# Patient Record
Sex: Female | Born: 1965 | Race: White | Hispanic: No | Smoking: Current every day smoker
Health system: Southern US, Community
[De-identification: ages and names within clinical notes are randomized; demographics above are authoritative.]

## PROBLEM LIST (undated history)

## (undated) DIAGNOSIS — E785 Hyperlipidemia, unspecified: Secondary | ICD-10-CM

## (undated) DIAGNOSIS — K219 Gastro-esophageal reflux disease without esophagitis: Secondary | ICD-10-CM

## (undated) DIAGNOSIS — I10 Essential (primary) hypertension: Secondary | ICD-10-CM

## (undated) DIAGNOSIS — E079 Disorder of thyroid, unspecified: Secondary | ICD-10-CM

---

## 2004-03-30 ENCOUNTER — Ambulatory Visit: Payer: Self-pay | Admitting: Internal Medicine

## 2004-12-19 ENCOUNTER — Emergency Department: Payer: Self-pay | Admitting: Emergency Medicine

## 2007-10-28 ENCOUNTER — Ambulatory Visit: Payer: Self-pay

## 2008-01-22 ENCOUNTER — Emergency Department: Payer: Self-pay | Admitting: Emergency Medicine

## 2008-07-17 ENCOUNTER — Emergency Department: Payer: Self-pay | Admitting: Emergency Medicine

## 2009-08-28 ENCOUNTER — Emergency Department: Payer: Self-pay | Admitting: Unknown Physician Specialty

## 2009-10-08 ENCOUNTER — Emergency Department: Payer: Self-pay | Admitting: Emergency Medicine

## 2009-10-25 ENCOUNTER — Ambulatory Visit: Payer: Self-pay | Admitting: Obstetrics & Gynecology

## 2009-10-28 ENCOUNTER — Ambulatory Visit: Payer: Self-pay | Admitting: Obstetrics & Gynecology

## 2011-05-14 ENCOUNTER — Emergency Department: Payer: Self-pay | Admitting: *Deleted

## 2013-12-26 ENCOUNTER — Emergency Department: Payer: Self-pay | Admitting: Internal Medicine

## 2014-08-14 ENCOUNTER — Ambulatory Visit
Admit: 2014-08-14 | Disposition: A | Discharge status: Home / Self Care | Payer: Self-pay | Attending: Internal Medicine | Admitting: Internal Medicine

## 2014-09-29 ENCOUNTER — Encounter: Payer: Self-pay | Admitting: Emergency Medicine

## 2014-09-29 DIAGNOSIS — I10 Essential (primary) hypertension: Secondary | ICD-10-CM | POA: Diagnosis not present

## 2014-09-29 DIAGNOSIS — Y998 Other external cause status: Secondary | ICD-10-CM | POA: Diagnosis not present

## 2014-09-29 DIAGNOSIS — R51 Headache: Secondary | ICD-10-CM | POA: Diagnosis present

## 2014-09-29 DIAGNOSIS — M545 Low back pain: Secondary | ICD-10-CM | POA: Diagnosis not present

## 2014-09-29 DIAGNOSIS — Y9389 Activity, other specified: Secondary | ICD-10-CM | POA: Insufficient documentation

## 2014-09-29 DIAGNOSIS — S1086XA Insect bite of other specified part of neck, initial encounter: Secondary | ICD-10-CM | POA: Diagnosis not present

## 2014-09-29 DIAGNOSIS — R42 Dizziness and giddiness: Secondary | ICD-10-CM | POA: Diagnosis not present

## 2014-09-29 DIAGNOSIS — W57XXXA Bitten or stung by nonvenomous insect and other nonvenomous arthropods, initial encounter: Secondary | ICD-10-CM | POA: Diagnosis not present

## 2014-09-29 DIAGNOSIS — J209 Acute bronchitis, unspecified: Secondary | ICD-10-CM | POA: Diagnosis not present

## 2014-09-29 DIAGNOSIS — Y9289 Other specified places as the place of occurrence of the external cause: Secondary | ICD-10-CM | POA: Diagnosis not present

## 2014-09-29 DIAGNOSIS — Z720 Tobacco use: Secondary | ICD-10-CM | POA: Diagnosis not present

## 2014-09-29 LAB — POCT RAPID STREP A: STREPTOCOCCUS, GROUP A SCREEN (DIRECT): NEGATIVE

## 2014-09-29 NOTE — ED Notes (Signed)
Pt presents to the ER from home with complaints of sinus congestion, pt reports she got a spider bite last Friday and ever since she has been experiencing headaches, pt reports she woke up this morning with redness to right eye. Pt reports she went to her PCP and was treated GERD. Pt talks in complete sentences no respiratory distress noted.

## 2014-09-30 ENCOUNTER — Emergency Department
Admission: EM | Admit: 2014-09-30 | Discharge: 2014-09-30 | Disposition: A | Discharge status: Home / Self Care | Payer: Medicaid Other | Attending: Emergency Medicine | Admitting: Emergency Medicine

## 2014-09-30 ENCOUNTER — Emergency Department: Payer: Medicaid Other

## 2014-09-30 DIAGNOSIS — J40 Bronchitis, not specified as acute or chronic: Secondary | ICD-10-CM

## 2014-09-30 DIAGNOSIS — J029 Acute pharyngitis, unspecified: Secondary | ICD-10-CM

## 2014-09-30 HISTORY — DX: Essential (primary) hypertension: I10

## 2014-09-30 HISTORY — DX: Hyperlipidemia, unspecified: E78.5

## 2014-09-30 HISTORY — DX: Disorder of thyroid, unspecified: E07.9

## 2014-09-30 LAB — CBC
HCT: 43.2 % (ref 35.0–47.0)
Hemoglobin: 14.3 g/dL (ref 12.0–16.0)
MCH: 31.1 pg (ref 26.0–34.0)
MCHC: 33.2 g/dL (ref 32.0–36.0)
MCV: 93.5 fL (ref 80.0–100.0)
Platelets: 185 10*3/uL (ref 150–440)
RBC: 4.61 MIL/uL (ref 3.80–5.20)
RDW: 12.3 % (ref 11.5–14.5)
WBC: 8.3 10*3/uL (ref 3.6–11.0)

## 2014-09-30 LAB — BASIC METABOLIC PANEL
Anion gap: 6 (ref 5–15)
BUN: 10 mg/dL (ref 6–20)
CO2: 26 mmol/L (ref 22–32)
CREATININE: 0.75 mg/dL (ref 0.44–1.00)
Calcium: 9.2 mg/dL (ref 8.9–10.3)
Chloride: 108 mmol/L (ref 101–111)
GFR calc Af Amer: >60 mL/min (ref >60)
GFR calc non Af Amer: >60 mL/min (ref >60)
Glucose, Bld: 93 mg/dL (ref 65–99)
Potassium: 3.5 mmol/L (ref 3.5–5.1)
SODIUM: 140 mmol/L (ref 135–145)

## 2014-09-30 LAB — TROPONIN I

## 2014-09-30 MED ORDER — METOCLOPRAMIDE HCL 5 MG/ML IJ SOLN
INTRAMUSCULAR | Status: AC
  Administered 2014-09-30: 10 mg via INTRAVENOUS
  Filled 2014-09-30: qty 2

## 2014-09-30 MED ORDER — SODIUM CHLORIDE 0.9 % IV BOLUS (SEPSIS)
1000.0000 mL | Freq: Once | INTRAVENOUS | Status: AC
  Administered 2014-09-30: 1000 mL via INTRAVENOUS

## 2014-09-30 MED ORDER — LIDOCAINE VISCOUS 2 % MT SOLN
OROMUCOSAL | Status: AC
  Administered 2014-09-30: 15 mL via OROMUCOSAL
  Filled 2014-09-30: qty 15

## 2014-09-30 MED ORDER — LIDOCAINE VISCOUS 2 % MT SOLN
15.0000 mL | Freq: Once | OROMUCOSAL | Status: AC
  Administered 2014-09-30: 15 mL via OROMUCOSAL

## 2014-09-30 MED ORDER — METOCLOPRAMIDE HCL 5 MG/ML IJ SOLN
10.0000 mg | Freq: Once | INTRAMUSCULAR | Status: AC
  Administered 2014-09-30: 10 mg via INTRAVENOUS

## 2014-09-30 MED ORDER — KETOROLAC TROMETHAMINE 30 MG/ML IJ SOLN
30.0000 mg | Freq: Once | INTRAMUSCULAR | Status: AC
  Administered 2014-09-30: 30 mg via INTRAVENOUS

## 2014-09-30 MED ORDER — BENZONATATE 100 MG PO CAPS: 100.0000 mg | ORAL_CAPSULE | Freq: Three times a day (TID) | ORAL | Status: AC | PRN

## 2014-09-30 MED ORDER — KETOROLAC TROMETHAMINE 30 MG/ML IJ SOLN
INTRAMUSCULAR | Status: AC
  Administered 2014-09-30: 30 mg via INTRAVENOUS
  Filled 2014-09-30: qty 1

## 2014-09-30 MED ORDER — AZITHROMYCIN 250 MG PO TABS: ORAL_TABLET | ORAL | Status: AC

## 2014-09-30 NOTE — ED Provider Notes (Signed)
[facility] Emergency Department Provider Note  ____________________________________________  Time seen: Approximately 3:15 AM  I have reviewed the triage vital signs and the nursing notes.   HISTORY  Chief Complaint Headache; Insect Bite; and Sore Throat    HPI Ronnette JuniperCarolyn Retana is a 49 y.o. female who comes in today reporting that she does not feel well. She reports that her throat is raw and burning down to her chest. She reports that she has pain in her temples and the back of her head. She reports that she went to the doctor today and when she came home fell asleep. She reports that when she woke up she felt dizzy. She reports that she has some blood this come to the surface of her right eye and she became concerned. She also reports that her gums are swollen inside her mouth. She reports the doctor told her that she may have acid reflux but did not do anything else for it. She reports that the symptoms has been going on for 3-4 days. He reports that she was also bitten by a spider in her pubic hairs. She reports that it initially itched and then she thought she saw pus coming out of the area of the bite mark. She reports that her neck feels swollen and puffy. She came in because she did not feel well.He reports that her pain is a 6 out of 10 in intensity.   Past Medical History  Diagnosis Date  . Hypertension   . Thyroid disease   . Hyperlipemia     There are no active problems to display for this patient.   History reviewed. No pertinent past surgical history.  Current Outpatient Rx  Name  Route  Sig  Dispense  Refill  . azithromycin (ZITHROMAX Z-PAK) 250 MG tablet      Take 2 tablets (500 mg) on  Day 1,  followed by 1 tablet (250 mg) once daily on Days 2 through 5.   6 each   0   . benzonatate (TESSALON PERLES) 100 MG capsule   Oral   Take 1 capsule (100 mg total) by mouth 3 (three) times daily as needed for cough.   15 capsule   0      Allergies Review of patient's allergies indicates no known allergies.  No family history on file.  Social History History  Substance Use Topics  . Smoking status: Current Every Day Smoker -- 0.50 packs/day    Types: Cigarettes  . Smokeless tobacco: Not on file  . Alcohol Use: No    Review of Systems Constitutional: No fever/chills Eyes: No visual changes. ENT:  sore throat. Cardiovascular:  chest pain. Respiratory: shortness of breath. Gastrointestinal: Loose stools Genitourinary: Negative for dysuria. Musculoskeletal:  back pain. Skin: Negative for rash. Neurological: Headache.  10-point ROS otherwise negative.  ____________________________________________   PHYSICAL EXAM:  VITAL SIGNS: ED Triage Vitals  Enc Vitals Group     BP 09/29/14 2226 169/66 mmHg     Pulse Rate 09/29/14 2226 71     Resp 09/29/14 2226 20     Temp 09/29/14 2226 98.5 F (36.9 C)     Temp Source 09/29/14 2226 Oral     SpO2 09/29/14 2226 99 %     Weight 09/29/14 2226 108 lb (48.988 kg)     Height 09/29/14 2226 5\' 2"  (1.575 m)     Head Cir --      Peak Flow --      Pain Score  09/29/14 2228 8     Pain Loc --      Pain Edu? --      Excl. in GC? --     Constitutional: Alert and oriented. Well appearing and in mild distress. Eyes: Conjunctivae are normal. PERRL. EOMI. Head: Atraumatic. Nose: No congestion/rhinnorhea. Mouth/Throat: Mucous membranes are moist.  Oropharynx non-erythematous. Cardiovascular: Normal rate, regular rhythm. Grossly normal heart sounds.  Good peripheral circulation. Respiratory: Normal respiratory effort.  No retractions. Lungs CTAB. Gastrointestinal: Soft and nontender. No distention. Positive bowel sounds Genitourinary: Deferred Musculoskeletal: No lower extremity tenderness nor edema.  No joint effusions. Neurologic:  Normal speech and language. No gross focal neurologic deficits are appreciated.   Skin:  Skin is warm, dry and intact. No rash  noted. Psychiatric: Mood and affect are normal.   ____________________________________________   LABS (all labs ordered are listed, but only abnormal results are displayed)  Labs Reviewed  CULTURE, GROUP A STREP ([facility] ONLY)  CBC  BASIC METABOLIC PANEL  TROPONIN I  POCT RAPID STREP A   ____________________________________________  EKG  ED ECG REPORT I, Rebecka Apley, the attending physician, personally viewed and interpreted this ECG.   Date: 09/30/2014  EKG Time: 429  Rate: 61  Rhythm: normal sinus rhythm  Axis: Normal  Intervals:none  ST&T Change: None  ____________________________________________  RADIOLOGY  Chest x-ray: Hyperinflation without localizing pulmonary process. This may be related to bronchitis asthma or smoking related lung disease ____________________________________________   PROCEDURES  Procedure(s) performed: None  Critical Care performed: No  ____________________________________________   INITIAL IMPRESSION / ASSESSMENT AND PLAN / ED COURSE  Pertinent labs & imaging results that were available during my care of the patient were reviewed by me and considered in my medical decision making (see chart for details).  This is a 49 year old female who comes in with multiple complaints including sore throat and shortness of breath chest pain and not feeling well. I will check some blood work. I will do a chest x-ray and give the patient some medicine for her headache. I will give the patient some fluid as well and reassessed when she's receive these interventions.  The patient does feel improved after the fluids and the medication. I will discharge the patient home with some medicine for cough as well as for bronchitis. The patient will follow back up with her primary care physician. ____________________________________________   FINAL CLINICAL IMPRESSION(S) / ED DIAGNOSES  Final diagnoses:  Bronchitis  Pharyngitis      Rebecka Apley, MD 09/30/14 [phone]

## 2014-09-30 NOTE — Discharge Instructions (Signed)
Sore Throat A sore throat is a painful, burning, sore, or scratchy feeling of the throat. There may be pain or tenderness when swallowing or talking. You may have other symptoms with a sore throat. These include coughing, sneezing, fever, or a swollen neck. A sore throat is often the first sign of another sickness. These sicknesses may include a cold, flu, strep throat, or an infection called mono. Most sore throats go away without medical treatment.  HOME CARE   Only take medicine as told by your doctor.  Drink enough fluids to keep your pee (urine) clear or pale yellow.  Rest as needed.  Try using throat sprays, lozenges, or suck on hard candy (if older than 4 years or as told).  Sip warm liquids, such as broth, herbal tea, or warm water with honey. Try sucking on frozen ice pops or drinking cold liquids.  Rinse the mouth (gargle) with salt water. Mix 1 teaspoon salt with 8 ounces of water.  Do not smoke. Avoid being around others when they are smoking.  Put a humidifier in your bedroom at night to moisten the air. You can also turn on a hot shower and sit in the bathroom for 5-10 minutes. Be sure the bathroom door is closed. GET HELP RIGHT AWAY IF:   You have trouble breathing.  You cannot swallow fluids, soft foods, or your spit (saliva).  You have more puffiness (swelling) in the throat.  Your sore throat does not get better in 7 days.  You feel sick to your stomach (nauseous) and throw up (vomit).  You have a fever or lasting symptoms for more than 2-3 days.  You have a fever and your symptoms suddenly get worse. MAKE SURE YOU:   Understand these instructions.  Will watch your condition.  Will get help right away if you are not doing well or get worse. Document Released: 01/25/2008 Document Revised: 01/10/2012 Document Reviewed: 12/24/2011 [facility] Patient Information 2015 [employer] [place]. This information is not intended to replace advice given to you by your health  care provider. Make sure you discuss any questions you have with your health care provider.  Upper Respiratory Infection, Adult An upper respiratory infection (URI) is also sometimes known as the common cold. The upper respiratory tract includes the nose, sinuses, throat, trachea, and bronchi. Bronchi are the airways leading to the lungs. Most people improve within 1 week, but symptoms can last up to 2 weeks. A residual cough may last even longer.  CAUSES Many different viruses can infect the tissues lining the upper respiratory tract. The tissues become irritated and inflamed and often become very moist. Mucus production is also common. A cold is contagious. You can easily spread the virus to others by oral contact. This includes kissing, sharing a glass, coughing, or sneezing. Touching your mouth or nose and then touching a surface, which is then touched by another person, can also spread the virus. SYMPTOMS  Symptoms typically develop 1 to 3 days after you come in contact with a cold virus. Symptoms vary from person to person. They may include:  Runny nose.  Sneezing.  Nasal congestion.  Sinus irritation.  Sore throat.  Loss of voice (laryngitis).  Cough.  Fatigue.  Muscle aches.  Loss of appetite.  Headache.  Low-grade fever. DIAGNOSIS  You might diagnose your own cold based on familiar symptoms, since most people get a cold 2 to 3 times a year. Your caregiver can confirm this based on your exam. Most importantly, your caregiver  can check that your symptoms are not due to another disease such as strep throat, sinusitis, pneumonia, asthma, or epiglottitis. Blood tests, throat tests, and X-rays are not necessary to diagnose a common cold, but they may sometimes be helpful in excluding other more serious diseases. Your caregiver will decide if any further tests are required. RISKS AND COMPLICATIONS  You may be at risk for a more severe case of the common cold if you smoke  cigarettes, have chronic heart disease (such as heart failure) or lung disease (such as asthma), or if you have a weakened immune system. The very young and very old are also at risk for more serious infections. Bacterial sinusitis, middle ear infections, and bacterial pneumonia can complicate the common cold. The common cold can worsen asthma and chronic obstructive pulmonary disease (COPD). Sometimes, these complications can require emergency medical care and may be life-threatening. PREVENTION  The best way to protect against getting a cold is to practice good hygiene. Avoid oral or hand contact with people with cold symptoms. Wash your hands often if contact occurs. There is no clear evidence that vitamin C, vitamin E, echinacea, or exercise reduces the chance of developing a cold. However, it is always recommended to get plenty of rest and practice good nutrition. TREATMENT  Treatment is directed at relieving symptoms. There is no cure. Antibiotics are not effective, because the infection is caused by a virus, not by bacteria. Treatment may include:  Increased fluid intake. Sports drinks offer valuable electrolytes, sugars, and fluids.  Breathing heated mist or steam (vaporizer or shower).  Eating chicken soup or other clear broths, and maintaining good nutrition.  Getting plenty of rest.  Using gargles or lozenges for comfort.  Controlling fevers with ibuprofen or acetaminophen as directed by your caregiver.  Increasing usage of your inhaler if you have asthma. Zinc gel and zinc lozenges, taken in the first 24 hours of the common cold, can shorten the duration and lessen the severity of symptoms. Pain medicines may help with fever, muscle aches, and throat pain. A variety of non-prescription medicines are available to treat congestion and runny nose. Your caregiver can make recommendations and may suggest nasal or lung inhalers for other symptoms.  HOME CARE INSTRUCTIONS   Only take  over-the-counter or prescription medicines for pain, discomfort, or fever as directed by your caregiver.  Use a warm mist humidifier or inhale steam from a shower to increase air moisture. This may keep secretions moist and make it easier to breathe.  Drink enough water and fluids to keep your urine clear or pale yellow.  Rest as needed.  Return to work when your temperature has returned to normal or as your caregiver advises. You may need to stay home longer to avoid infecting others. You can also use a face mask and careful hand washing to prevent spread of the virus. SEEK MEDICAL CARE IF:   After the first few days, you feel you are getting worse rather than better.  You need your caregiver's advice about medicines to control symptoms.  You develop chills, worsening shortness of breath, or brown or red sputum. These may be signs of pneumonia.  You develop yellow or brown nasal discharge or pain in the face, especially when you bend forward. These may be signs of sinusitis.  You develop a fever, swollen neck glands, pain with swallowing, or white areas in the back of your throat. These may be signs of strep throat. SEEK IMMEDIATE MEDICAL CARE IF:  You have a fever.  You develop severe or persistent headache, ear pain, sinus pain, or chest pain.  You develop wheezing, a prolonged cough, cough up blood, or have a change in your usual mucus (if you have chronic lung disease).  You develop sore muscles or a stiff neck. Document Released: 10/11/2000 Document Revised: 07/10/2011 Document Reviewed: 07/23/2013 [facility] Patient Information 2015 [place], [place]. This information is not intended to replace advice given to you by your health care provider. Make sure you discuss any questions you have with your health care provider.

## 2014-10-02 LAB — CULTURE, GROUP A STREP (THRC)

## 2014-11-10 ENCOUNTER — Emergency Department
Admission: EM | Admit: 2014-11-10 | Discharge: 2014-11-10 | Disposition: A | Discharge status: Home / Self Care | Payer: Medicaid Other | Attending: Emergency Medicine | Admitting: Emergency Medicine

## 2014-11-10 ENCOUNTER — Emergency Department: Payer: Medicaid Other

## 2014-11-10 DIAGNOSIS — Y998 Other external cause status: Secondary | ICD-10-CM | POA: Diagnosis not present

## 2014-11-10 DIAGNOSIS — X58XXXA Exposure to other specified factors, initial encounter: Secondary | ICD-10-CM | POA: Diagnosis not present

## 2014-11-10 DIAGNOSIS — S299XXA Unspecified injury of thorax, initial encounter: Secondary | ICD-10-CM | POA: Insufficient documentation

## 2014-11-10 DIAGNOSIS — Y9389 Activity, other specified: Secondary | ICD-10-CM | POA: Diagnosis not present

## 2014-11-10 DIAGNOSIS — Z720 Tobacco use: Secondary | ICD-10-CM | POA: Diagnosis not present

## 2014-11-10 DIAGNOSIS — I10 Essential (primary) hypertension: Secondary | ICD-10-CM | POA: Diagnosis not present

## 2014-11-10 DIAGNOSIS — T148XXA Other injury of unspecified body region, initial encounter: Secondary | ICD-10-CM

## 2014-11-10 DIAGNOSIS — Y9289 Other specified places as the place of occurrence of the external cause: Secondary | ICD-10-CM | POA: Insufficient documentation

## 2014-11-10 LAB — POCT RAPID STREP A: STREPTOCOCCUS, GROUP A SCREEN (DIRECT): NEGATIVE

## 2014-11-10 MED ORDER — IBUPROFEN 800 MG PO TABS: 800.0000 mg | ORAL_TABLET | Freq: Three times a day (TID) | ORAL | Status: DC | PRN

## 2014-11-10 NOTE — Discharge Instructions (Signed)
Please seek medical attention for any high fevers, chest pain, shortness of breath, change in behavior, persistent vomiting, bloody stool or any other new or concerning symptoms.  Muscle Strain A muscle strain is an injury that occurs when a muscle is stretched beyond its normal length. Usually a small number of muscle fibers are torn when this happens. Muscle strain is rated in degrees. First-degree strains have the least amount of muscle fiber tearing and pain. Second-degree and third-degree strains have increasingly more tearing and pain.  Usually, recovery from muscle strain takes 1-2 weeks. Complete healing takes 5-6 weeks.  CAUSES  Muscle strain happens when a sudden, violent force placed on a muscle stretches it too far. This may occur with lifting, sports, or a fall.  RISK FACTORS Muscle strain is especially common in athletes.  SIGNS AND SYMPTOMS At the site of the muscle strain, there may be:  Pain.  Bruising.  Swelling.  Difficulty using the muscle due to pain or lack of normal function. DIAGNOSIS  Your health care provider will perform a physical exam and ask about your medical history. TREATMENT  Often, the best treatment for a muscle strain is resting, icing, and applying cold compresses to the injured area.  HOME CARE INSTRUCTIONS   Use the PRICE method of treatment to promote muscle healing during the first 2-3 days after your injury. The PRICE method involves:  Protecting the muscle from being injured again.  Restricting your activity and resting the injured body part.  Icing your injury. To do this, put ice in a plastic bag. Place a towel between your skin and the bag. Then, apply the ice and leave it on from 15-20 minutes each hour. After the third day, switch to moist heat packs.  Apply compression to the injured area with a splint or elastic bandage. Be careful not to wrap it too tightly. This may interfere with blood circulation or increase swelling.  Elevate  the injured body part above the level of your heart as often as you can.  Only take over-the-counter or prescription medicines for pain, discomfort, or fever as directed by your health care provider.  Warming up prior to exercise helps to prevent future muscle strains. SEEK MEDICAL CARE IF:   You have increasing pain or swelling in the injured area.  You have numbness, tingling, or a significant loss of strength in the injured area. MAKE SURE YOU:   Understand these instructions.  Will watch your condition.  Will get help right away if you are not doing well or get worse. Document Released: 04/17/2005 Document Revised: 02/05/2013 Document Reviewed: 11/14/2012 [facility] Patient Information 2015 [place], [place]. This information is not intended to replace advice given to you by your health care provider. Make sure you discuss any questions you have with your health care provider.
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
## 2014-11-12 LAB — CULTURE, GROUP A STREP (THRC)

## 2014-11-29 ENCOUNTER — Emergency Department: Payer: Medicaid Other

## 2014-11-29 DIAGNOSIS — Z79899 Other long term (current) drug therapy: Secondary | ICD-10-CM | POA: Diagnosis not present

## 2014-11-29 DIAGNOSIS — S99922A Unspecified injury of left foot, initial encounter: Secondary | ICD-10-CM | POA: Insufficient documentation

## 2014-11-29 DIAGNOSIS — I10 Essential (primary) hypertension: Secondary | ICD-10-CM | POA: Insufficient documentation

## 2014-11-29 DIAGNOSIS — Y9301 Activity, walking, marching and hiking: Secondary | ICD-10-CM | POA: Diagnosis not present

## 2014-11-29 DIAGNOSIS — W228XXA Striking against or struck by other objects, initial encounter: Secondary | ICD-10-CM | POA: Diagnosis not present

## 2014-11-29 DIAGNOSIS — Y998 Other external cause status: Secondary | ICD-10-CM | POA: Diagnosis not present

## 2014-11-29 DIAGNOSIS — Y9289 Other specified places as the place of occurrence of the external cause: Secondary | ICD-10-CM | POA: Diagnosis not present

## 2014-11-29 DIAGNOSIS — Z720 Tobacco use: Secondary | ICD-10-CM | POA: Diagnosis not present

## 2014-11-29 NOTE — ED Notes (Signed)
Mom says on Friday night she accidentally kicked a stool in her garden; swelling to the top of her left foot; ambulatory with no difficulty

## 2014-11-30 ENCOUNTER — Emergency Department
Admission: EM | Admit: 2014-11-30 | Discharge: 2014-11-30 | Disposition: A | Discharge status: Home / Self Care | Payer: Medicaid Other | Attending: Emergency Medicine | Admitting: Emergency Medicine

## 2014-11-30 ENCOUNTER — Encounter: Payer: Self-pay | Admitting: Emergency Medicine

## 2014-11-30 DIAGNOSIS — M79672 Pain in left foot: Secondary | ICD-10-CM

## 2014-11-30 HISTORY — DX: Gastro-esophageal reflux disease without esophagitis: K21.9

## 2014-11-30 NOTE — Discharge Instructions (Signed)
Please seek medical attention for any high fevers, chest pain, shortness of breath, change in behavior, persistent vomiting, bloody stool or any other new or concerning symptoms. ° °Musculoskeletal Pain °Musculoskeletal pain is muscle and boney aches and pains. These pains can occur in any part of the body. Your caregiver may treat you without knowing the cause of the pain. They may treat you if blood or urine tests, X-rays, and other tests were normal.  °CAUSES °There is often not a definite cause or reason for these pains. These pains may be caused by a type of germ (virus). The discomfort may also come from overuse. Overuse includes working out too hard when your body is not fit. Boney aches also come from weather changes. Bone is sensitive to atmospheric pressure changes. °HOME CARE INSTRUCTIONS  °· Ask when your test results will be ready. Make sure you get your test results. °· Only take over-the-counter or prescription medicines for pain, discomfort, or fever as directed by your caregiver. If you were given medications for your condition, do not drive, operate machinery or power tools, or sign legal documents for 24 hours. Do not drink alcohol. Do not take sleeping pills or other medications that may interfere with treatment. °· Continue all activities unless the activities cause more pain. When the pain lessens, slowly resume normal activities. Gradually increase the intensity and duration of the activities or exercise. °· During periods of severe pain, bed rest may be helpful. Lay or sit in any position that is comfortable. °· Putting ice on the injured area. °¨ Put ice in a bag. °¨ Place a towel between your skin and the bag. °¨ Leave the ice on for 15 to 20 minutes, 3 to 4 times a day. °· Follow up with your caregiver for continued problems and no reason can be found for the pain. If the pain becomes worse or does not go away, it may be necessary to repeat tests or do additional testing. Your caregiver  may need to look further for a possible cause. °SEEK IMMEDIATE MEDICAL CARE IF: °· You have pain that is getting worse and is not relieved by medications. °· You develop chest pain that is associated with shortness or breath, sweating, feeling sick to your stomach (nauseous), or throw up (vomit). °· Your pain becomes localized to the abdomen. °· You develop any new symptoms that seem different or that concern you. °MAKE SURE YOU:  °· Understand these instructions. °· Will watch your condition. °· Will get help right away if you are not doing well or get worse. °Document Released: 04/17/2005 Document Revised: 07/10/2011 Document Reviewed: 12/20/2012 °[employer]® Patient Information ©2015 [employer]. This information is not intended to replace advice given to you by your health care provider. Make sure you discuss any questions you have with your health care provider. ° °

## 2014-11-30 NOTE — ED Provider Notes (Signed)
[facility] Emergency Department Provider Note    ____________________________________________  Time seen: 0300  I have reviewed the triage vital signs and the nursing notes.   HISTORY  Chief Complaint Foot Pain   History limited by: Not Limited   HPI Beth Moore is a 49 y.o. female who presents to the emergency department with concerns for left foot pain. She states that she hit it against a stool whilst walking in the dark the night before last. Since then she has had pain. She feels like she cannot bend her toes quite this effectively. She denies any numbness. She denies any other injuries. Denies any fevers.   Past Medical History  Diagnosis Date  . Hypertension   . Thyroid disease   . Hyperlipemia   . GERD (gastroesophageal reflux disease)     There are no active problems to display for this patient.   History reviewed. No pertinent past surgical history.  Current Outpatient Rx  Name  Route  Sig  Dispense  Refill  . benzonatate (TESSALON PERLES) 100 MG capsule   Oral   Take 1 capsule (100 mg total) by mouth 3 (three) times daily as needed for cough.   15 capsule   0   . diltiazem (CARDIZEM CD) 120 MG 24 hr capsule   Oral   Take 1 capsule by mouth daily.      6   . ibuprofen (ADVIL,MOTRIN) 800 MG tablet   Oral   Take 1 tablet (800 mg total) by mouth every 8 (eight) hours as needed.   30 tablet   0   . lovastatin (MEVACOR) 10 MG tablet   Oral   Take 10 mg by mouth at bedtime.      6   . meloxicam (MOBIC) 15 MG tablet   Oral   Take 1 tablet by mouth as needed.      0   . montelukast (SINGULAIR) 10 MG tablet   Oral   Take 1 tablet by mouth at bedtime.      3   . omeprazole (PRILOSEC) 40 MG capsule   Oral   Take 40 mg by mouth daily.      5   . SYNTHROID 100 MCG tablet   Oral   Take 1 tablet by mouth every morning.      6     Dispense as written.   . traMADol (ULTRAM) 50 MG tablet   Oral   Take 50 mg by  mouth as needed.      1     Allergies Review of patient's allergies indicates no known allergies.  History reviewed. No pertinent family history.  Social History History  Substance Use Topics  . Smoking status: Current Every Day Smoker -- 0.50 packs/day    Types: Cigarettes  . Smokeless tobacco: Not on file  . Alcohol Use: No    Review of Systems  Constitutional: Negative for fever. Cardiovascular: Negative for chest pain. Respiratory: Negative for shortness of breath. Gastrointestinal: Negative for abdominal pain, vomiting and diarrhea. Genitourinary: Negative for dysuria. Musculoskeletal: Negative for back pain. Left foot pain Skin: Negative for rash. Neurological: Negative for headaches, focal weakness or numbness.   10-point ROS otherwise negative.  ____________________________________________   PHYSICAL EXAM:  VITAL SIGNS: ED Triage Vitals  Enc Vitals Group     BP 11/29/14 2229 149/91 mmHg     Pulse Rate 11/29/14 2229 74     Resp 11/29/14 2229 18     Temp 11/29/14  2229 98.1 F (36.7 C)     Temp Source 11/29/14 2229 Oral     SpO2 11/29/14 2229 99 %     Weight 11/29/14 2229 108 lb (48.988 kg)     Height 11/29/14 2229  (1.575 m)     Head Cir --      Peak Flow --      Pain Score 11/29/14 2230 3   Constitutional: Alert and oriented. Well appearing and in no distress. Eyes: Conjunctivae are normal. PERRL. Normal extraocular movements. ENT   Head: Normocephalic and atraumatic.   Nose: No congestion/rhinnorhea.   Mouth/Throat: Mucous membranes are moist.   Neck: No stridor. Hematological/Lymphatic/Immunilogical: No cervical lymphadenopathy. Cardiovascular: Normal rate, regular rhythm.  No murmurs, rubs, or gallops. Respiratory: Normal respiratory effort without tachypnea nor retractions. Breath sounds are clear and equal bilaterally. No wheezes/rales/rhonchi. Gastrointestinal: Soft and nontender. No distention.  Genitourinary:  Deferred Musculoskeletal: Normal range of motion in all extremities. No joint effusions.  No lower extremity tenderness nor edema. Neurologic:  Normal speech and language. No gross focal neurologic deficits are appreciated. Speech is normal.  Skin:  Skin is warm, dry and intact. No rash noted. Psychiatric: Mood and affect are normal. Speech and behavior are normal. Patient exhibits appropriate insight and judgment.  ____________________________________________    LABS (pertinent positives/negatives)  None  ____________________________________________   EKG  None  ____________________________________________    RADIOLOGY  Left Foot IMPRESSION: Negative.  ____________________________________________   PROCEDURES  Procedure(s) performed: None  Critical Care performed: No  ____________________________________________   INITIAL IMPRESSION / ASSESSMENT AND PLAN / ED COURSE  Pertinent labs & imaging results that were available during my care of the patient were reviewed by me and considered in my medical decision making (see chart for details).  Patient here with left foot pain after kicking a stool. X-rays negative. No concerning findings on physical exam.  ____________________________________________   FINAL CLINICAL IMPRESSION(S) / ED DIAGNOSES  Final diagnoses:  Left foot pain     Phineas Semen, MD 11/30/14 [phone]

## 2015-09-02 ENCOUNTER — Other Ambulatory Visit: Payer: Self-pay | Admitting: *Deleted

## 2016-07-27 ENCOUNTER — Encounter: Payer: Self-pay | Admitting: Emergency Medicine

## 2016-07-27 ENCOUNTER — Emergency Department
Admission: EM | Admit: 2016-07-27 | Discharge: 2016-07-27 | Disposition: A | Discharge status: Home / Self Care | Payer: Medicaid Other | Attending: Emergency Medicine | Admitting: Emergency Medicine

## 2016-07-27 DIAGNOSIS — Z79899 Other long term (current) drug therapy: Secondary | ICD-10-CM | POA: Diagnosis not present

## 2016-07-27 DIAGNOSIS — F17210 Nicotine dependence, cigarettes, uncomplicated: Secondary | ICD-10-CM | POA: Diagnosis not present

## 2016-07-27 DIAGNOSIS — I10 Essential (primary) hypertension: Secondary | ICD-10-CM | POA: Diagnosis not present

## 2016-07-27 DIAGNOSIS — L259 Unspecified contact dermatitis, unspecified cause: Secondary | ICD-10-CM | POA: Diagnosis not present

## 2016-07-27 DIAGNOSIS — L299 Pruritus, unspecified: Secondary | ICD-10-CM | POA: Diagnosis present

## 2016-07-27 MED ORDER — BACITRACIN ZINC 500 UNIT/GM EX OINT
TOPICAL_OINTMENT | Freq: Once | CUTANEOUS | Status: AC
  Administered 2016-07-27: 1 via TOPICAL
  Filled 2016-07-27: qty 0.9

## 2016-07-27 NOTE — ED Notes (Signed)
Pt discharged home after verbalizing understanding of discharge instructions; nad noted. 

## 2016-07-27 NOTE — ED Triage Notes (Signed)
Patient ambulatory to triage with steady gait, without difficulty or distress noted; pt reports "spider bite"; indicates small area to left upper arm of discolored skin; no redness or ulceration noted

## 2016-07-27 NOTE — Discharge Instructions (Signed)
You may apply calamine lotion or Benadryl cream as needed for relief of symptoms. Return to the ER for worsening symptoms, persistent vomiting, difficulty breathing, facial swelling or other concerns.

## 2016-07-27 NOTE — ED Provider Notes (Signed)
[facility] Emergency Department Provider Note   ____________________________________________   First MD Initiated Contact with Patient 07/27/16 [phone]     (approximate)  I have reviewed the triage vital signs and the nursing notes.   HISTORY  Chief Complaint Insect Bite    HPI Beth Moore is a 51 y.o. female who presents to the ED from home with a chief complaint of "spider bite". Patient noted small area to left upper arm which was burning, itchy, stinging and painful approximately 4 PM yesterday.Shortly thereafter she saw a small black spider and was concerned for spider bite. Denies fever, chills, chest pain, shortness of breath, abdominal pain, nausea, vomiting, diarrhea. Denies recent travel or trauma. Nothing makes her symptoms better or worse.   Past Medical History:  Diagnosis Date  . GERD (gastroesophageal reflux disease)   . Hyperlipemia   . Hypertension   . Thyroid disease     There are no active problems to display for this patient.   History reviewed. No pertinent surgical history.  Prior to Admission medications   Medication Sig Start Date End Date Taking? Authorizing Provider  diltiazem (CARDIZEM CD) 120 MG 24 hr capsule Take 1 capsule by mouth daily. 09/30/14   Historical Provider, MD  ibuprofen (ADVIL,MOTRIN) 800 MG tablet Take 1 tablet (800 mg total) by mouth every 8 (eight) hours as needed. 11/10/14   Phineas SemenGraydon Goodman, MD  lovastatin (MEVACOR) 10 MG tablet Take 10 mg by mouth at bedtime. 09/30/14   Historical Provider, MD  meloxicam (MOBIC) 15 MG tablet Take 1 tablet by mouth as needed. 08/12/14   Historical Provider, MD  montelukast (SINGULAIR) 10 MG tablet Take 1 tablet by mouth at bedtime. 09/30/14   Historical Provider, MD  omeprazole (PRILOSEC) 40 MG capsule Take 40 mg by mouth daily. 09/30/14   Historical Provider, MD  SYNTHROID 100 MCG tablet Take 1 tablet by mouth every morning. 09/30/14   Historical Provider, MD  traMADol (ULTRAM)  50 MG tablet Take 50 mg by mouth as needed. 08/11/14   Historical Provider, MD    Allergies Patient has no known allergies.  No family history on file.  Social History Social History  Substance Use Topics  . Smoking status: Current Every Day Smoker    Packs/day: 0.50    Types: Cigarettes  . Smokeless tobacco: Never Used  . Alcohol use No    Review of Systems  Constitutional: No fever/chills. Eyes: No visual changes. ENT: No sore throat. Cardiovascular: Denies chest pain. Respiratory: Denies shortness of breath. Gastrointestinal: No abdominal pain.  No nausea, no vomiting.  No diarrhea.  No constipation. Genitourinary: Negative for dysuria. Musculoskeletal: Negative for back pain. Skin: Positive for rash. Neurological: Negative for headaches, focal weakness or numbness.  10-point ROS otherwise negative.  ____________________________________________   PHYSICAL EXAM:  VITAL SIGNS: ED Triage Vitals  Enc Vitals Group     BP 07/27/16 0225 (!) 153/87     Pulse Rate 07/27/16 0225 69     Resp 07/27/16 0225 16     Temp 07/27/16 0225 99 F (37.2 C)     Temp Source 07/27/16 0225 Oral     SpO2 07/27/16 0225 99 %     Weight 07/27/16 0225 118 lb (53.5 kg)     Height 07/27/16 0225 5\' 3"  (1.6 m)     Head Circumference --      Peak Flow --      Pain Score 07/27/16 0535 4     Pain Loc --  Pain Edu? --      Excl. in GC? --     Constitutional: Alert and oriented. Well appearing and in no acute distress. Eyes: Conjunctivae are normal. PERRL. EOMI. Head: Atraumatic. Nose: No congestion/rhinnorhea. Mouth/Throat: Mucous membranes are moist.  Oropharynx non-erythematous. Neck: No stridor.   Cardiovascular: Normal rate, regular rhythm. Grossly normal heart sounds.  Good peripheral circulation. Respiratory: Normal respiratory effort.  No retractions. Lungs CTAB. Gastrointestinal: Soft and nontender. No distention. No abdominal bruits. No CVA tenderness. Musculoskeletal: No  lower extremity tenderness nor edema.  No joint effusions. Neurologic:  Normal speech and language. No gross focal neurologic deficits are appreciated. No gait instability. Skin:  Skin is warm, dry and intact. No petechiae. Small, approximately dime size area to left upper arm which appears vesicular in appearance without erythema or warmth.  Psychiatric: Mood and affect are normal. Speech and behavior are normal.  ____________________________________________   LABS (all labs ordered are listed, but only abnormal results are displayed)  Labs Reviewed - No data to display ____________________________________________  EKG  None ____________________________________________  RADIOLOGY  None ____________________________________________   PROCEDURES  Procedure(s) performed: None  Procedures  Critical Care performed: No  ____________________________________________   INITIAL IMPRESSION / ASSESSMENT AND PLAN / ED COURSE  Pertinent labs & imaging results that were available during my care of the patient were reviewed by me and considered in my medical decision making (see chart for details).  50 year old female who presents with localized allergic reaction possibly secondary to insect sting. Patient does not recall coming in contact with poison ivy or poison oak but states it may be possible. There is no airway involvement or angioedema. Will apply bacitracin ointment. Recommended patient to try calamine lotion or Benadryl topical as needed. Strict return precautions given. Patient verbalizes understanding and agrees with plan of care.      ____________________________________________   FINAL CLINICAL IMPRESSION(S) / ED DIAGNOSES  Final diagnoses:  Contact dermatitis, unspecified contact dermatitis type, unspecified trigger      NEW MEDICATIONS STARTED DURING THIS VISIT:  Discharge Medication List as of 07/27/2016  6:56 AM       Note:  This document was prepared  using Dragon voice recognition software and may include unintentional dictation errors.    Irean Hong, MD 07/27/16 [phone]

## 2016-08-15 ENCOUNTER — Encounter: Payer: Self-pay | Admitting: *Deleted

## 2016-08-15 ENCOUNTER — Emergency Department
Admission: EM | Admit: 2016-08-15 | Discharge: 2016-08-15 | Disposition: A | Discharge status: Home / Self Care | Payer: Medicaid Other | Attending: Emergency Medicine | Admitting: Emergency Medicine

## 2016-08-15 DIAGNOSIS — Y9389 Activity, other specified: Secondary | ICD-10-CM | POA: Diagnosis not present

## 2016-08-15 DIAGNOSIS — F17210 Nicotine dependence, cigarettes, uncomplicated: Secondary | ICD-10-CM | POA: Diagnosis not present

## 2016-08-15 DIAGNOSIS — I10 Essential (primary) hypertension: Secondary | ICD-10-CM | POA: Insufficient documentation

## 2016-08-15 DIAGNOSIS — S0990XA Unspecified injury of head, initial encounter: Secondary | ICD-10-CM

## 2016-08-15 DIAGNOSIS — Y929 Unspecified place or not applicable: Secondary | ICD-10-CM | POA: Diagnosis not present

## 2016-08-15 DIAGNOSIS — Y999 Unspecified external cause status: Secondary | ICD-10-CM | POA: Insufficient documentation

## 2016-08-15 DIAGNOSIS — W228XXA Striking against or struck by other objects, initial encounter: Secondary | ICD-10-CM | POA: Diagnosis not present

## 2016-08-15 DIAGNOSIS — S060X0A Concussion without loss of consciousness, initial encounter: Secondary | ICD-10-CM | POA: Insufficient documentation

## 2016-08-15 MED ORDER — ONDANSETRON 4 MG PO TBDP
4.0000 mg | ORAL_TABLET | Freq: Three times a day (TID) | ORAL | 0 refills | Status: DC | PRN
Start: 2016-08-15 — End: 2023-06-20

## 2016-08-15 NOTE — ED Triage Notes (Signed)
Pt says that on Sunday night she was putting groceries in her trunk and the wind knocked the trunk down, striking the top of her head. No LOC. No blood thinners. Intermittent blurry vision, headache and nausea. No meds PTA

## 2016-08-15 NOTE — ED Notes (Signed)

## 2016-08-15 NOTE — ED Provider Notes (Signed)
[facility] Emergency Department Provider Note  ____________________________________________  Time seen: Approximately 9:54 PM  I have reviewed the triage vital signs and the nursing notes.   HISTORY  Chief Complaint Head Injury    HPI Beth Moore is a 51 y.o. female who complains of a headache 2 days ago. She was leaning over her trunk of the car when the strong wind blew the trunk lid down hitting her on the apex of the skull. No loss of consciousness. No neck pain. Since then she's had intermittent blurry vision diffuse headache, nausea without vomiting. No syncope. Also reports changes in concentration and occasionally feeling off balance. No falls. No numbness tingling or weakness.  Does not take blood thinners. No prior history of TBI.     Past Medical History:  Diagnosis Date  . GERD (gastroesophageal reflux disease)   . Hyperlipemia   . Hypertension   . Thyroid disease      There are no active problems to display for this patient.    History reviewed. No pertinent surgical history.   Prior to Admission medications   Medication Sig Start Date End Date Taking? Authorizing Provider  diltiazem (CARDIZEM CD) 120 MG 24 hr capsule Take 1 capsule by mouth daily. 09/30/14   Historical Provider, MD  ibuprofen (ADVIL,MOTRIN) 800 MG tablet Take 1 tablet (800 mg total) by mouth every 8 (eight) hours as needed. 11/10/14   Phineas Semen, MD  lovastatin (MEVACOR) 10 MG tablet Take 10 mg by mouth at bedtime. 09/30/14   Historical Provider, MD  meloxicam (MOBIC) 15 MG tablet Take 1 tablet by mouth as needed. 08/12/14   Historical Provider, MD  montelukast (SINGULAIR) 10 MG tablet Take 1 tablet by mouth at bedtime. 09/30/14   Historical Provider, MD  omeprazole (PRILOSEC) 40 MG capsule Take 40 mg by mouth daily. 09/30/14   Historical Provider, MD  ondansetron (ZOFRAN ODT) 4 MG disintegrating tablet Take 1 tablet (4 mg total) by mouth every 8 (eight) hours as  needed for nausea or vomiting. 08/15/16   Sharman Cheek, MD  SYNTHROID 100 MCG tablet Take 1 tablet by mouth every morning. 09/30/14   Historical Provider, MD  traMADol (ULTRAM) 50 MG tablet Take 50 mg by mouth as needed. 08/11/14   Historical Provider, MD     Allergies Patient has no known allergies.   No family history on file.  Social History Social History  Substance Use Topics  . Smoking status: Current Every Day Smoker    Packs/day: 0.50    Types: Cigarettes  . Smokeless tobacco: Never Used  . Alcohol use No    Review of Systems  Constitutional:   No fever or chills.  Cardiovascular:   No chest pain. Respiratory:   No dyspnea or cough. Gastrointestinal:   Negative for abdominal pain, vomiting and diarrhea.  Musculoskeletal:   Negative for focal pain or swelling Neurological:   Positive as above for headaches 10-point ROS otherwise negative.  ____________________________________________   PHYSICAL EXAM:  VITAL SIGNS: ED Triage Vitals [08/15/16 2025]  Enc Vitals Group     BP (!) 160/94     Pulse Rate 92     Resp 18     Temp 98.7 F (37.1 C)     Temp Source Oral     SpO2 94 %     Weight 118 lb (53.5 kg)     Height  (1.6 m)     Head Circumference      Peak Flow  Pain Score 4     Pain Loc      Pain Edu?      Excl. in GC?     Vital signs reviewed, nursing assessments reviewed.   Constitutional:   Alert and oriented. Well appearing and in no distress. Eyes:   No scleral icterus. No conjunctival pallor. PERRL. EOMI.  No nystagmus. ENT   Head:   Normocephalic and atraumatic.Slight tenderness at the apex of the skull without bony deformity or step-off   Nose:   No congestion/rhinnorhea. No septal hematoma   Mouth/Throat:   MMM, no pharyngeal erythema. No peritonsillar mass.    Neck:   No stridor. No SubQ emphysema. No meningismus. Hematological/Lymphatic/Immunilogical:   No cervical lymphadenopathy. Cardiovascular:   RRR. Symmetric  bilateral radial and DP pulses.  No murmurs.  Respiratory:   Normal respiratory effort without tachypnea nor retractions. Breath sounds are clear and equal bilaterally. No wheezes/rales/rhonchi.  Musculoskeletal:   Normal range of motion in all extremities. No joint effusions.  No lower extremity tenderness.  No edema. Neurologic:   Normal speech and language.  CN 2-10 normal. Motor grossly intact. Normal cerebellar testing. Normal gait. No gross focal neurologic deficits are appreciated.  Skin:    Skin is warm, dry and intact. No rash noted.  No petechiae, purpura, or bullae.  ____________________________________________    LABS (pertinent positives/negatives) (all labs ordered are listed, but only abnormal results are displayed) Labs Reviewed - No data to display ____________________________________________   EKG    ____________________________________________    RADIOLOGY  No results found.  ____________________________________________   PROCEDURES Procedures  ____________________________________________   INITIAL IMPRESSION / ASSESSMENT AND PLAN / ED COURSE  Pertinent labs & imaging results that were available during my care of the patient were reviewed by me and considered in my medical decision making (see chart for details).  Patient presents 2 days after blunt head trauma. Low risk mechanism. Symptoms consistent with concussion. Very low suspicion of a clinically important head injury such as intracranial hemorrhage. Return precautions given for any worsening of condition, discharge home, Zofran as needed, follow up with primary care.         ____________________________________________   FINAL CLINICAL IMPRESSION(S) / ED DIAGNOSES  Final diagnoses:  Minor head injury, initial encounter  Concussion without loss of consciousness, initial encounter      New Prescriptions   ONDANSETRON (ZOFRAN ODT) 4 MG DISINTEGRATING TABLET    Take 1 tablet (4  mg total) by mouth every 8 (eight) hours as needed for nausea or vomiting.     Portions of this note were generated with dragon dictation software. Dictation errors may occur despite best attempts at proofreading.    Sharman Cheek, MD 08/15/16 2159

## 2016-08-20 ENCOUNTER — Emergency Department: Payer: Medicaid Other

## 2016-08-20 ENCOUNTER — Emergency Department
Admission: EM | Admit: 2016-08-20 | Discharge: 2016-08-20 | Disposition: A | Discharge status: Home / Self Care | Payer: Medicaid Other | Attending: Emergency Medicine | Admitting: Emergency Medicine

## 2016-08-20 DIAGNOSIS — W228XXD Striking against or struck by other objects, subsequent encounter: Secondary | ICD-10-CM | POA: Diagnosis not present

## 2016-08-20 DIAGNOSIS — R51 Headache: Secondary | ICD-10-CM | POA: Diagnosis present

## 2016-08-20 DIAGNOSIS — I10 Essential (primary) hypertension: Secondary | ICD-10-CM | POA: Insufficient documentation

## 2016-08-20 DIAGNOSIS — F17210 Nicotine dependence, cigarettes, uncomplicated: Secondary | ICD-10-CM | POA: Diagnosis not present

## 2016-08-20 DIAGNOSIS — Z79899 Other long term (current) drug therapy: Secondary | ICD-10-CM | POA: Diagnosis not present

## 2016-08-20 DIAGNOSIS — J0100 Acute maxillary sinusitis, unspecified: Secondary | ICD-10-CM | POA: Insufficient documentation

## 2016-08-20 DIAGNOSIS — S060X0D Concussion without loss of consciousness, subsequent encounter: Secondary | ICD-10-CM | POA: Insufficient documentation

## 2016-08-20 MED ORDER — AMOXICILLIN-POT CLAVULANATE 875-125 MG PO TABS: 1.0000 | ORAL_TABLET | Freq: Two times a day (BID) | ORAL | 0 refills | Status: AC
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
## 2016-08-20 NOTE — Discharge Instructions (Signed)
Present medications as prescribed and return to the ER for any increasing headache, vision changes worsening symptoms or urgent changes in her health.
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
## 2016-08-20 NOTE — ED Notes (Signed)
Pt taken to CT.

## 2017-06-05 ENCOUNTER — Other Ambulatory Visit: Payer: Self-pay | Admitting: Family Medicine

## 2017-06-05 DIAGNOSIS — Z1239 Encounter for other screening for malignant neoplasm of breast: Secondary | ICD-10-CM

## 2017-06-13 ENCOUNTER — Encounter: Payer: Self-pay | Admitting: *Deleted
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
## 2022-12-28 ENCOUNTER — Emergency Department
Admission: EM | Admit: 2022-12-28 | Discharge: 2022-12-28 | Disposition: A | Discharge status: Home / Self Care | Payer: Medicaid Other | Attending: Emergency Medicine | Admitting: Emergency Medicine

## 2022-12-28 ENCOUNTER — Other Ambulatory Visit: Payer: Self-pay

## 2022-12-28 DIAGNOSIS — I10 Essential (primary) hypertension: Secondary | ICD-10-CM | POA: Insufficient documentation

## 2022-12-28 DIAGNOSIS — W540XXA Bitten by dog, initial encounter: Secondary | ICD-10-CM | POA: Insufficient documentation

## 2022-12-28 DIAGNOSIS — Y92828 Other wilderness area as the place of occurrence of the external cause: Secondary | ICD-10-CM | POA: Diagnosis not present

## 2022-12-28 DIAGNOSIS — L03115 Cellulitis of right lower limb: Secondary | ICD-10-CM | POA: Diagnosis not present

## 2022-12-28 DIAGNOSIS — S91051A Open bite, right ankle, initial encounter: Secondary | ICD-10-CM | POA: Insufficient documentation

## 2022-12-28 LAB — CBC
HCT: 43.9 % (ref 36.0–46.0)
Hemoglobin: 14.6 g/dL (ref 12.0–15.0)
MCH: 31.3 pg (ref 26.0–34.0)
MCHC: 33.3 g/dL (ref 30.0–36.0)
MCV: 94.2 fL (ref 80.0–100.0)
Platelets: 257 10*3/uL (ref 150–400)
RBC: 4.66 MIL/uL (ref 3.87–5.11)
RDW: 11.7 % (ref 11.5–15.5)
WBC: 8.3 10*3/uL (ref 4.0–10.5)
nRBC: 0 % (ref 0.0–0.2)

## 2022-12-28 LAB — BASIC METABOLIC PANEL
Anion gap: 9 (ref 5–15)
BUN: 15 mg/dL (ref 6–20)
CO2: 24 mmol/L (ref 22–32)
Calcium: 9.2 mg/dL (ref 8.9–10.3)
Chloride: 105 mmol/L (ref 98–111)
Creatinine, Ser: 1.01 mg/dL — ABNORMAL HIGH (ref 0.44–1.00)
GFR, Estimated: >60 mL/min (ref >60)
Glucose, Bld: 97 mg/dL (ref 70–99)
Potassium: 3.6 mmol/L (ref 3.5–5.1)
Sodium: 138 mmol/L (ref 135–145)

## 2022-12-28 MED ORDER — AMOXICILLIN-POT CLAVULANATE 875-125 MG PO TABS: 1.0000 | ORAL_TABLET | Freq: Two times a day (BID) | ORAL | 0 refills | Status: AC

## 2022-12-28 MED ORDER — AMOXICILLIN-POT CLAVULANATE 875-125 MG PO TABS
1.0000 | ORAL_TABLET | Freq: Once | ORAL | Status: AC
  Administered 2022-12-28: 1 via ORAL
  Filled 2022-12-28: qty 1

## 2022-12-28 NOTE — ED Triage Notes (Signed)
Pt to ED for dog bite over a week ago to right foot. Reports foot is now swelling and turning red. Reports dog UTD on shots.

## 2022-12-28 NOTE — ED Provider Notes (Signed)
[facility] Provider Note    Event Date/Time   First MD Initiated Contact with Patient 12/28/22 1637     (approximate)   History   Chief Complaint Animal Bite   HPI  Beth Moore is a 57 y.o. female with past medical history of hypertension, hyperlipidemia, and hypothyroidism who presents to the ED complaining of animal bite.  Patient reports that 1 week ago she was at a dog park when another person's dog ran up into her right foot, accidentally clamped down on her right ankle.  She reports a small bite to the area which she has been cleaning with water and hydrogen peroxide.  She states that the dog is up-to-date on rabies vaccines.  Over the past couple of days, she has noticed increasing redness and pain tracking up her foot and ankle.  She has been able to bear weight on the leg but states it is painful to do so.  She has not noticed significant swelling to the area and denies any fevers or drainage.     Physical Exam   Triage Vital Signs: ED Triage Vitals  Encounter Vitals Group     BP 12/28/22 1617 (!) 174/91     Systolic BP Percentile --      Diastolic BP Percentile --      Pulse Rate 12/28/22 1614 83     Resp 12/28/22 1614 16     Temp 12/28/22 1614 98.5 F (36.9 C)     Temp src --      SpO2 12/28/22 1614 99 %     Weight 12/28/22 1615 98 lb (44.5 kg)     Height 12/28/22 1615 5' 2.5" (1.588 m)     Head Circumference --      Peak Flow --      Pain Score 12/28/22 1615 3     Pain Loc --      Pain Education --      Exclude from Growth Chart --     Most recent vital signs: Vitals:   12/28/22 1614 12/28/22 1617  BP:  (!) 174/91  Pulse: 83   Resp: 16   Temp: 98.5 F (36.9 C)   SpO2: 99%     Constitutional: Alert and oriented. Eyes: Conjunctivae are normal. Head: Atraumatic. Nose: No congestion/rhinnorhea. Mouth/Throat: Mucous membranes are moist.  Cardiovascular: Normal rate, regular rhythm. Grossly normal heart sounds.  2+ radial  and DP pulses bilaterally. Respiratory: Normal respiratory effort.  No retractions. Lungs CTAB. Gastrointestinal: Soft and nontender. No distention. Musculoskeletal: Tenderness to palpation over right proximal foot and ankle with central ulceration and surrounding erythema and warmth, no edema or focal fluctuance noted.  She is able to range her right ankle with minimal pain. Neurologic:  Normal speech and language. No gross focal neurologic deficits are appreciated.      ED Results / Procedures / Treatments   Labs (all labs ordered are listed, but only abnormal results are displayed) Labs Reviewed  BASIC METABOLIC PANEL - Abnormal; Notable for the following components:      Result Value   Creatinine, Ser 1.01 (*)    All other components within normal limits  CBC    PROCEDURES:  Critical Care performed: No  Procedures   MEDICATIONS ORDERED IN ED: Medications  amoxicillin-clavulanate (AUGMENTIN) 875-125 MG per tablet 1 tablet (has no administration in time range)     IMPRESSION / MDM / ASSESSMENT AND PLAN / ED COURSE  I reviewed the triage vital signs  and the nursing notes.                              57 y.o. female with past medical history of hypertension, hyperlipidemia, and hypothyroidism who presents to the ED complaining of animal bite to her right ankle 1 week ago, now with increasing redness and pain to the area.  Patient's presentation is most consistent with acute presentation with potential threat to life or bodily function.  Differential diagnosis includes, but is not limited to, cellulitis, abscess, sepsis, septic arthritis.  Patient nontoxic-appearing and in no acute distress, vital signs are unremarkable.  She has ulceration with surrounding cellulitis to her right proximal foot and ankle, no findings concerning for abscess or septic arthritis at this time.  Patient reports that dog is up-to-date on vaccines, no need for rabies prophylaxis at this time.   Patient given dose of Augmentin and extent of cellulitis was marked with a skin marker.  She is appropriate for outpatient follow-up with her PCP, was counseled to return to the ED for new or worsening symptoms.  Patient agrees with plan.      FINAL CLINICAL IMPRESSION(S) / ED DIAGNOSES   Final diagnoses:  Cellulitis of right ankle  Dog bite, initial encounter     Rx / DC Orders   ED Discharge Orders          Ordered    amoxicillin-clavulanate (AUGMENTIN) 875-125 MG tablet  2 times daily        12/28/22 1710             Note:  This document was prepared using Dragon voice recognition software and may include unintentional dictation errors.   Chesley Noon, MD 12/28/22 [phone]

## 2022-12-29 ENCOUNTER — Other Ambulatory Visit: Payer: Self-pay

## 2022-12-29 ENCOUNTER — Emergency Department
Admission: EM | Admit: 2022-12-29 | Discharge: 2022-12-29 | Disposition: A | Discharge status: Home / Self Care | Payer: Medicaid Other | Attending: Emergency Medicine | Admitting: Emergency Medicine

## 2022-12-29 DIAGNOSIS — X58XXXD Exposure to other specified factors, subsequent encounter: Secondary | ICD-10-CM | POA: Diagnosis not present

## 2022-12-29 DIAGNOSIS — I10 Essential (primary) hypertension: Secondary | ICD-10-CM | POA: Diagnosis not present

## 2022-12-29 DIAGNOSIS — Z4800 Encounter for change or removal of nonsurgical wound dressing: Secondary | ICD-10-CM | POA: Insufficient documentation

## 2022-12-29 DIAGNOSIS — S91301D Unspecified open wound, right foot, subsequent encounter: Secondary | ICD-10-CM | POA: Diagnosis not present

## 2022-12-29 DIAGNOSIS — Z5189 Encounter for other specified aftercare: Secondary | ICD-10-CM

## 2022-12-29 DIAGNOSIS — E039 Hypothyroidism, unspecified: Secondary | ICD-10-CM | POA: Insufficient documentation

## 2022-12-29 LAB — COMPREHENSIVE METABOLIC PANEL
ALT: 11 U/L (ref 0–44)
AST: 17 U/L (ref 15–41)
Albumin: 4.2 g/dL (ref 3.5–5.0)
Alkaline Phosphatase: 45 U/L (ref 38–126)
Anion gap: 10 (ref 5–15)
BUN: 16 mg/dL (ref 6–20)
CO2: 27 mmol/L (ref 22–32)
Calcium: 9.4 mg/dL (ref 8.9–10.3)
Chloride: 103 mmol/L (ref 98–111)
Creatinine, Ser: 0.83 mg/dL (ref 0.44–1.00)
GFR, Estimated: >60 mL/min (ref >60)
Glucose, Bld: 74 mg/dL (ref 70–99)
Potassium: 3.6 mmol/L (ref 3.5–5.1)
Sodium: 140 mmol/L (ref 135–145)
Total Bilirubin: 1.3 mg/dL — ABNORMAL HIGH (ref 0.3–1.2)
Total Protein: 7.2 g/dL (ref 6.5–8.1)

## 2022-12-29 LAB — CBC WITH DIFFERENTIAL/PLATELET
Abs Immature Granulocytes: 0.02 10*3/uL (ref 0.00–0.07)
Basophils Absolute: 0.1 10*3/uL (ref 0.0–0.1)
Basophils Relative: 1 %
Eosinophils Absolute: 0.1 10*3/uL (ref 0.0–0.5)
Eosinophils Relative: 1 %
HCT: 43.6 % (ref 36.0–46.0)
Hemoglobin: 14.4 g/dL (ref 12.0–15.0)
Immature Granulocytes: 0 %
Lymphocytes Relative: 28 %
Lymphs Abs: 2 10*3/uL (ref 0.7–4.0)
MCH: 31 pg (ref 26.0–34.0)
MCHC: 33 g/dL (ref 30.0–36.0)
MCV: 93.8 fL (ref 80.0–100.0)
Monocytes Absolute: 0.3 10*3/uL (ref 0.1–1.0)
Monocytes Relative: 4 %
Neutro Abs: 4.7 10*3/uL (ref 1.7–7.7)
Neutrophils Relative %: 66 %
Platelets: 264 10*3/uL (ref 150–400)
RBC: 4.65 MIL/uL (ref 3.87–5.11)
RDW: 11.7 % (ref 11.5–15.5)
WBC: 7.1 10*3/uL (ref 4.0–10.5)
nRBC: 0 % (ref 0.0–0.2)

## 2022-12-29 NOTE — ED Provider Notes (Signed)
[facility] Provider Note    Event Date/Time   First MD Initiated Contact with Patient 12/29/22 1316     (approximate)   History   No chief complaint on file.   HPI  Beth Moore is a 57 y.o. female with PMH of HTN, HLD and hypothyroidism presents to the ED for evaluation of a wound on her right foot.  Patient was seen yesterday for the same and diagnosed with cellulitis.  The extent of the erythema and swelling was marked and she was advised to return to the ED if it spread outside of that boundary.  Patient has noticed the redness is spreading towards her toes.  She was given a dose of augmentin while in the ED and took another dose this morning.      Physical Exam   Triage Vital Signs: ED Triage Vitals  Encounter Vitals Group     BP 12/29/22 1246 (!) 159/101     Systolic BP Percentile --      Diastolic BP Percentile --      Pulse Rate 12/29/22 1246 74     Resp 12/29/22 1246 16     Temp 12/29/22 1246 98.1 F (36.7 C)     Temp Source 12/29/22 1246 Oral     SpO2 12/29/22 1246 99 %     Weight 12/29/22 1244 98 lb 1.7 oz (44.5 kg)     Height 12/29/22 1244 5' 2.5" (1.588 m)     Head Circumference --      Peak Flow --      Pain Score 12/29/22 1244 0     Pain Loc --      Pain Education --      Exclude from Growth Chart --     Most recent vital signs: Vitals:   12/29/22 1246  BP: (!) 159/101  Pulse: 74  Resp: 16  Temp: 98.1 F (36.7 C)  SpO2: 99%    General: Awake, no distress.  CV:  Good peripheral perfusion.  Resp:  Normal effort.  Abd:  No distention.  Other:  Tenderness to palpation over the right proximal foot and ankle, with an ulceration on the lateral right proximal foot, no edema or fluctuance, erythema has spread outside of the marked border on the distal foot.  Dorsalis pedis pulses 2+ and regular, ROM of ankle maintained.    ED Results / Procedures / Treatments   Labs (all labs ordered are listed, but only abnormal  results are displayed) Labs Reviewed  COMPREHENSIVE METABOLIC PANEL - Abnormal; Notable for the following components:      Result Value   Total Bilirubin 1.3 (*)    All other components within normal limits  CBC WITH DIFFERENTIAL/PLATELET      PROCEDURES:  Critical Care performed: No  Procedures   MEDICATIONS ORDERED IN ED: Medications - No data to display   IMPRESSION / MDM / ASSESSMENT AND PLAN / ED COURSE  I reviewed the triage vital signs and the nursing notes.                             57 year old female presents for a wound check.  VSS other than an elevated BP and patient NAD on exam.  Differential diagnosis includes, but is not limited to, cellulitis, sepsis, abscess.  Patient's presentation is most consistent with acute, uncomplicated illness.  Patient is afebrile and CBC, CMP are unremarkable.  Given these reassuring findings I  do not suspect sepsis.  I explained to the patient that the swelling spreading towards her toes is likely the result of gravity rather than a spreading infection.  I advised her to keep an eye out for the infection spreading out in all directions especially moving proximally up her leg.  At this point I do not feel she warrants IV antibiotics.  I advised her to continue with her oral antibiotics.  Patient voiced understanding, all questions were answered and she was stable at discharge.    FINAL CLINICAL IMPRESSION(S) / ED DIAGNOSES   Final diagnoses:  Visit for wound check     Rx / DC Orders   ED Discharge Orders     None        Note:  This document was prepared using Dragon voice recognition software and may include unintentional dictation errors.   Cameron Ali, PA-C 12/29/22 1351    Dionne Bucy, MD 12/29/22 [phone]

## 2022-12-29 NOTE — ED Triage Notes (Signed)
Seen yesterday for right foot injury.  Was told to return if discoloration on right foot worsens to return.    Slight discoloration extending past marked line on foot.

## 2022-12-29 NOTE — Discharge Instructions (Signed)
Please continue taking your oral antibiotics. Wash the wound with soap and water daily. Change your dressing daily. Cover wound with triple antibiotic ointment and a bandage. Watch for signs of spreading infection including redness outside of the drawn border moving up your leg. If you develop this, return to the ED.

## 2023-06-19 ENCOUNTER — Emergency Department
Admission: EM | Admit: 2023-06-19 | Discharge: 2023-06-20 | Disposition: A | Discharge status: Home / Self Care | Payer: Medicaid Other | Attending: Emergency Medicine | Admitting: Emergency Medicine

## 2023-06-19 ENCOUNTER — Other Ambulatory Visit: Payer: Self-pay

## 2023-06-19 DIAGNOSIS — R110 Nausea: Secondary | ICD-10-CM | POA: Insufficient documentation

## 2023-06-19 DIAGNOSIS — I10 Essential (primary) hypertension: Secondary | ICD-10-CM | POA: Diagnosis not present

## 2023-06-19 DIAGNOSIS — R053 Chronic cough: Secondary | ICD-10-CM | POA: Insufficient documentation

## 2023-06-19 DIAGNOSIS — R519 Headache, unspecified: Secondary | ICD-10-CM | POA: Diagnosis present

## 2023-06-19 LAB — BASIC METABOLIC PANEL
Anion gap: 13 (ref 5–15)
BUN: 11 mg/dL (ref 6–20)
CO2: 25 mmol/L (ref 22–32)
Calcium: 9.3 mg/dL (ref 8.9–10.3)
Chloride: 98 mmol/L (ref 98–111)
Creatinine, Ser: 0.73 mg/dL (ref 0.44–1.00)
GFR, Estimated: >60 mL/min (ref >60)
Glucose, Bld: 124 mg/dL — ABNORMAL HIGH (ref 70–99)
Potassium: 3.5 mmol/L (ref 3.5–5.1)
Sodium: 136 mmol/L (ref 135–145)

## 2023-06-19 LAB — RESP PANEL BY RT-PCR (RSV, FLU A&B, COVID)  RVPGX2
Influenza A by PCR: NEGATIVE
Influenza B by PCR: NEGATIVE
Resp Syncytial Virus by PCR: NEGATIVE
SARS Coronavirus 2 by RT PCR: NEGATIVE

## 2023-06-19 LAB — CBC
HCT: 43.1 % (ref 36.0–46.0)
Hemoglobin: 14.3 g/dL (ref 12.0–15.0)
MCH: 30.9 pg (ref 26.0–34.0)
MCHC: 33.2 g/dL (ref 30.0–36.0)
MCV: 93.1 fL (ref 80.0–100.0)
Platelets: 199 10*3/uL (ref 150–400)
RBC: 4.63 MIL/uL (ref 3.87–5.11)
RDW: 11.7 % (ref 11.5–15.5)
WBC: 5.3 10*3/uL (ref 4.0–10.5)
nRBC: 0 % (ref 0.0–0.2)

## 2023-06-19 NOTE — ED Triage Notes (Signed)
 Pt to ED via POV with multiple complaints. Pt reports friend has syphillis in his eye and pt was tested 2 weeks ago for it but wants to be checked again because she read it can show up later. Pt also complaining of multiple insect bites on back and scalp. Doesn't know what bit her. Having headache since yesterday. Also c/o of throat swelling when she wakes up in the mornings x1 month, difficult to catch her breath. Denies CP, SHOB,. Fevers, dizziness

## 2023-06-20 MED ORDER — ONDANSETRON 4 MG PO TBDP: 4.0000 mg | ORAL_TABLET | Freq: Three times a day (TID) | ORAL | 0 refills | Status: DC | PRN

## 2023-06-20 MED ORDER — PANTOPRAZOLE SODIUM 40 MG PO TBEC: 40.0000 mg | DELAYED_RELEASE_TABLET | Freq: Every day | ORAL | 0 refills | Status: DC

## 2023-06-20 NOTE — ED Notes (Signed)
 Pt discharged to ED circle at this time and left with all belongings. Pt ABCs intact. RR even and unlabored. Pt in NAD. Pt denies further needs from this RN.

## 2023-06-20 NOTE — ED Provider Notes (Signed)
 [facility] Provider Note    Event Date/Time   First MD Initiated Contact with Patient 06/20/23 0001     (approximate)   History   Chief Complaint Multiple Complaints   HPI  Page Pucciarelli is a 58 y.o. female with past medical history of hypertension, hyperlipidemia, and GERD who presents to the ED complaining of headache and cough.  Patient ports that she has been dealing with a cough with intermittent neck swelling for the past month.  She states that she wakes up in the morning with a cough and sensation that her throat is swelling and some difficulty breathing.  The symptoms quickly resolved and she denies any associated pain in her chest.  She denies any fevers and has not had any pain or swelling in her legs.  She additionally complains of intermittent headaches, will feel nauseous at times but has not vomited.  She states she has been seen by her PCP for this problem but "nobody can figure out what is going on."  She denies any pain in her abdomen and has not had any dysuria.     Physical Exam   Triage Vital Signs: ED Triage Vitals  Encounter Vitals Group     BP 06/19/23 1918 (!) 155/85     Systolic BP Percentile --      Diastolic BP Percentile --      Pulse Rate 06/19/23 1918 83     Resp 06/19/23 1918 18     Temp 06/19/23 1918 99.8 F (37.7 C)     Temp Source 06/19/23 1918 Oral     SpO2 06/19/23 1918 99 %     Weight 06/19/23 1915 105 lb (47.6 kg)     Height 06/19/23 1915 5\' 2"  (1.575 m)     Head Circumference --      Peak Flow --      Pain Score 06/19/23 1915 8     Pain Loc --      Pain Education --      Exclude from Growth Chart --     Most recent vital signs: Vitals:   06/19/23 1918  BP: (!) 155/85  Pulse: 83  Resp: 18  Temp: 99.8 F (37.7 C)  SpO2: 99%    Constitutional: Alert and oriented. Eyes: Conjunctivae are normal. Head: Atraumatic. Neck: No edema, erythema, or tenderness to the neck. Nose: No  congestion/rhinnorhea. Mouth/Throat: Mucous membranes are moist.  Posterior oropharynx with no erythema, edema, or exudates. Cardiovascular: Normal rate, regular rhythm. Grossly normal heart sounds.  2+ radial pulses bilaterally. Respiratory: Normal respiratory effort.  No retractions. Lungs CTAB. Gastrointestinal: Soft and nontender. No distention. Musculoskeletal: No lower extremity tenderness nor edema.  Neurologic:  Normal speech and language. No gross focal neurologic deficits are appreciated.    ED Results / Procedures / Treatments   Labs (all labs ordered are listed, but only abnormal results are displayed) Labs Reviewed  BASIC METABOLIC PANEL - Abnormal; Notable for the following components:      Result Value   Glucose, Bld 124 (*)    All other components within normal limits  RESP PANEL BY RT-PCR (RSV, FLU A&B, COVID)  RVPGX2  CBC    PROCEDURES:  Critical Care performed: No  Procedures   MEDICATIONS ORDERED IN ED: Medications - No data to display   IMPRESSION / MDM / ASSESSMENT AND PLAN / ED COURSE  I reviewed the triage vital signs and the nursing notes.  58 y.o. female with past medical history of hypertension, hyperlipidemia, and GERD who presents to the ED complaining of cough when waking up with some mild difficulty breathing and sensation of throat swelling that then quickly resolves.  Patient's presentation is most consistent with acute presentation with potential threat to life or bodily function.  Differential diagnosis includes, but is not limited to, allergic reaction, pharyngitis, abscess, pneumonia, bronchitis, COVID-19, influenza, GERD.  Patient nontoxic-appearing and in no acute distress, vital signs are unremarkable.  COVID and flu testing is negative, labs are reassuring with no significant anemia, leukocytosis, electrolyte abnormality, or AKI.  She denies any chest pain or shortness of breath currently, no findings  concerning for pneumonia or ACS.  I would consider GERD given her description of symptoms, patient has a history of this but does not currently take a PPI.  We will start her on pantoprazole, also prescribe Zofran for use as needed.  She is appropriate for discharge home with outpatient follow-up, was counseled to return to the ED for new or worsening symptoms.  Patient agrees with plan.      FINAL CLINICAL IMPRESSION(S) / ED DIAGNOSES   Final diagnoses:  Chronic cough  Nausea     Rx / DC Orders   ED Discharge Orders          Ordered    pantoprazole (PROTONIX) 40 MG tablet  Daily        06/20/23 0138    ondansetron (ZOFRAN-ODT) 4 MG disintegrating tablet  Every 8 hours PRN        06/20/23 0138             Note:  This document was prepared using Dragon voice recognition software and may include unintentional dictation errors.   Chesley Noon, MD 06/20/23 [phone]

## 2023-08-26 ENCOUNTER — Emergency Department
Admission: EM | Admit: 2023-08-26 | Discharge: 2023-08-26 | Disposition: A | Discharge status: Home / Self Care | Attending: Emergency Medicine | Admitting: Emergency Medicine

## 2023-08-26 ENCOUNTER — Emergency Department

## 2023-08-26 ENCOUNTER — Other Ambulatory Visit: Payer: Self-pay

## 2023-08-26 DIAGNOSIS — F419 Anxiety disorder, unspecified: Secondary | ICD-10-CM | POA: Diagnosis not present

## 2023-08-26 DIAGNOSIS — R221 Localized swelling, mass and lump, neck: Secondary | ICD-10-CM | POA: Diagnosis present

## 2023-08-26 DIAGNOSIS — J432 Centrilobular emphysema: Secondary | ICD-10-CM | POA: Insufficient documentation

## 2023-08-26 DIAGNOSIS — I10 Essential (primary) hypertension: Secondary | ICD-10-CM | POA: Diagnosis not present

## 2023-08-26 DIAGNOSIS — J0100 Acute maxillary sinusitis, unspecified: Secondary | ICD-10-CM | POA: Diagnosis not present

## 2023-08-26 LAB — BASIC METABOLIC PANEL WITH GFR
Anion gap: 5 (ref 5–15)
BUN: 16 mg/dL (ref 6–20)
CO2: 29 mmol/L (ref 22–32)
Calcium: 9.4 mg/dL (ref 8.9–10.3)
Chloride: 102 mmol/L (ref 98–111)
Creatinine, Ser: 0.81 mg/dL (ref 0.44–1.00)
GFR, Estimated: >60 mL/min (ref >60)
Glucose, Bld: 95 mg/dL (ref 70–99)
Potassium: 3.9 mmol/L (ref 3.5–5.1)
Sodium: 136 mmol/L (ref 135–145)

## 2023-08-26 LAB — CBC WITH DIFFERENTIAL/PLATELET
Abs Immature Granulocytes: 0.02 10*3/uL (ref 0.00–0.07)
Basophils Absolute: 0.1 10*3/uL (ref 0.0–0.1)
Basophils Relative: 1 %
Eosinophils Absolute: 0.2 10*3/uL (ref 0.0–0.5)
Eosinophils Relative: 2 %
HCT: 49.6 % — ABNORMAL HIGH (ref 36.0–46.0)
Hemoglobin: 16.3 g/dL — ABNORMAL HIGH (ref 12.0–15.0)
Immature Granulocytes: 0 %
Lymphocytes Relative: 16 %
Lymphs Abs: 1.2 10*3/uL (ref 0.7–4.0)
MCH: 31 pg (ref 26.0–34.0)
MCHC: 32.9 g/dL (ref 30.0–36.0)
MCV: 94.5 fL (ref 80.0–100.0)
Monocytes Absolute: 0.4 10*3/uL (ref 0.1–1.0)
Monocytes Relative: 5 %
Neutro Abs: 5.6 10*3/uL (ref 1.7–7.7)
Neutrophils Relative %: 76 %
Platelets: 238 10*3/uL (ref 150–400)
RBC: 5.25 MIL/uL — ABNORMAL HIGH (ref 3.87–5.11)
RDW: 11.9 % (ref 11.5–15.5)
WBC: 7.4 10*3/uL (ref 4.0–10.5)
nRBC: 0 % (ref 0.0–0.2)

## 2023-08-26 MED ORDER — IOHEXOL 300 MG/ML  SOLN
75.0000 mL | Freq: Once | INTRAMUSCULAR | Status: AC | PRN
  Administered 2023-08-26: 75 mL via INTRAVENOUS

## 2023-08-26 MED ORDER — ALBUTEROL SULFATE HFA 108 (90 BASE) MCG/ACT IN AERS: 2.0000 | INHALATION_SPRAY | RESPIRATORY_TRACT | 1 refills | Status: AC | PRN

## 2023-08-26 MED ORDER — PREDNISONE 10 MG (21) PO TBPK: ORAL_TABLET | ORAL | 0 refills | Status: DC

## 2023-08-26 NOTE — ED Triage Notes (Addendum)
 Pt states swelling and issues with neck to L side of neck. Pt states seen for same, pt denies any fevers or chills. Pt has f/up scheduled. Pt states eating and drinking okay. NAD noted. Ongoing since December.   Pt hypertensive in triage, pt states not taking BP meds "because I didn't think I needed too".

## 2023-08-26 NOTE — ED Provider Notes (Signed)
 [facility] Provider Note    Event Date/Time   First MD Initiated Contact with Patient 08/26/23 0818     (approximate)   History   No chief complaint on file.   HPI  Beth Moore is a 58 y.o. female with history of hypertension, hyperlipidemia, GERD and as listed in EMR presents to the emergency department for treatment and evaluation of left side neck swelling.  She states that she woke up this morning with the sensation of throat swelling and felt like she could not breathe which caused her to become extremely anxious.  This happens to her several times per week and has been ongoing since December.  She saw her primary care provider who wants her to have a CT of her neck.  She also feels that occasionally there is some sort of hard lump that sticks out but that comes and goes and is not there today.  Also, she is having pain in her coccyx area.  As a child, she broke her tailbone and now she is having a burning, tingling sensation in the areas at times.       Physical Exam   Triage Vital Signs: ED Triage Vitals  Encounter Vitals Group     BP 08/26/23 0813 (!) 181/100     Systolic BP Percentile --      Diastolic BP Percentile --      Pulse Rate 08/26/23 0812 91     Resp 08/26/23 0812 20     Temp 08/26/23 0812 97.9 F (36.6 C)     Temp Source 08/26/23 0812 Oral     SpO2 08/26/23 0812 100 %     Weight 08/26/23 0811 104 lb 15 oz (47.6 kg)     Height 08/26/23 0811 5\' 2"  (1.575 m)     Head Circumference --      Peak Flow --      Pain Score 08/26/23 0811 0     Pain Loc --      Pain Education --      Exclude from Growth Chart --     Most recent vital signs: Vitals:   08/26/23 0812 08/26/23 0813  BP:  (!) 181/100  Pulse: 91   Resp: 20   Temp: 97.9 F (36.6 C)   SpO2: 100%     General: Awake, no distress.  CV:  Good peripheral perfusion.  Resp:  Normal effort.  Breath sounds diminished throughout. Abd:  No distention.  Other:  No palpable  mass on left lateral neck.  Airway is patent.  Tongue is not edematous.  Uvula is midline and normal size.  Speech is clear.   ED Results / Procedures / Treatments   Labs (all labs ordered are listed, but only abnormal results are displayed) Labs Reviewed  CBC WITH DIFFERENTIAL/PLATELET - Abnormal; Notable for the following components:      Result Value   RBC 5.25 (*)    Hemoglobin 16.3 (*)    HCT 49.6 (*)    All other components within normal limits  BASIC METABOLIC PANEL WITH GFR     EKG  Not indicated   RADIOLOGY  Image and radiology report reviewed and interpreted by me. Radiology report consistent with the same.  CT of the soft tissue neck with contrast shows no concerning findings.  She does have emphysema on the visualized lung fields.  Is also noted that she has sinusitis.  PROCEDURES:  Critical Care performed: No  Procedures   MEDICATIONS  ORDERED IN ED:  Medications  iohexol (OMNIPAQUE) 300 MG/ML solution 75 mL (75 mLs Intravenous Contrast Given 08/26/23 1010)     IMPRESSION / MDM / ASSESSMENT AND PLAN / ED COURSE   I have reviewed the triage note.  Differential diagnosis includes, but is not limited to, cervical adenopathy, panic/anxiety, malignancy  Patient's presentation is most consistent with acute illness / injury with system symptoms.  58 year old female presenting to the emergency department with multiple medical complaints.  See HPI for further details.  Triage vital signs indicate that she is hypertensive.  She is asymptomatic. May be related to her level of anxiety.  Exam is very reassuring.  The patient does appear to be very anxious.  I see no evidence of any airway edema and her voice is clear.  CT soft tissue neck without acute concerns with the exception of sinusitis.  X-ray image of the coccyx and sacrum shows remote fracture of the coccyx that is well-healed.  Results discussed with the patient who reports feeling reassured.  She  believes that her son who suffered a TBI about 15 years ago may be smoking something at night that is causing her symptoms.  Plan will be to discharge her home with an albuterol inhaler and prednisone.  She reports that her primary care referred her to ENT and she is to keep that appointment as scheduled as well as her follow-up with primary care.  She was advised that if her symptoms change or worsen and she is unable to see primary care or the specialist that she should return to the emergency department.      FINAL CLINICAL IMPRESSION(S) / ED DIAGNOSES   Final diagnoses:  Centrilobular emphysema (HCC)  Acute non-recurrent maxillary sinusitis  Anxiety     Rx / DC Orders   ED Discharge Orders          Ordered    albuterol (VENTOLIN HFA) 108 (90 Base) MCG/ACT inhaler  Every 4 hours PRN        08/26/23 1107    predniSONE (STERAPRED UNI-PAK 21 TAB) 10 MG (21) TBPK tablet        08/26/23 1107             Note:  This document was prepared using Dragon voice recognition software and may include unintentional dictation errors.   Sherryle Don, FNP 08/26/23 1403    Shane Darling, MD 08/26/23 1500

## 2023-08-26 NOTE — ED Notes (Signed)
 Pt given a ice pack, for her neck

## 2023-09-17 ENCOUNTER — Other Ambulatory Visit: Payer: Self-pay | Admitting: Gastroenterology

## 2023-09-17 DIAGNOSIS — R590 Localized enlarged lymph nodes: Secondary | ICD-10-CM

## 2023-09-27 ENCOUNTER — Ambulatory Visit
Admission: RE | Admit: 2023-09-27 | Discharge: 2023-09-27 | Disposition: A | Discharge status: Home / Self Care | Source: Ambulatory Visit | Attending: Gastroenterology | Admitting: Gastroenterology

## 2023-09-27 DIAGNOSIS — R590 Localized enlarged lymph nodes: Secondary | ICD-10-CM

## 2023-10-30 ENCOUNTER — Ambulatory Visit: Admitting: Anesthesiology

## 2023-10-30 ENCOUNTER — Ambulatory Visit
Admission: RE | Admit: 2023-10-30 | Discharge: 2023-10-30 | Disposition: A | Discharge status: Home / Self Care | Source: Ambulatory Visit | Attending: Gastroenterology | Admitting: Gastroenterology

## 2023-10-30 ENCOUNTER — Other Ambulatory Visit: Payer: Self-pay

## 2023-10-30 ENCOUNTER — Encounter
Admission: RE | Disposition: A | Discharge status: Home / Self Care | Payer: Self-pay | Source: Ambulatory Visit | Attending: Gastroenterology

## 2023-10-30 DIAGNOSIS — F17200 Nicotine dependence, unspecified, uncomplicated: Secondary | ICD-10-CM | POA: Insufficient documentation

## 2023-10-30 DIAGNOSIS — K219 Gastro-esophageal reflux disease without esophagitis: Secondary | ICD-10-CM | POA: Insufficient documentation

## 2023-10-30 DIAGNOSIS — J449 Chronic obstructive pulmonary disease, unspecified: Secondary | ICD-10-CM | POA: Diagnosis not present

## 2023-10-30 DIAGNOSIS — E039 Hypothyroidism, unspecified: Secondary | ICD-10-CM | POA: Diagnosis not present

## 2023-10-30 DIAGNOSIS — K641 Second degree hemorrhoids: Secondary | ICD-10-CM | POA: Insufficient documentation

## 2023-10-30 DIAGNOSIS — I10 Essential (primary) hypertension: Secondary | ICD-10-CM | POA: Diagnosis not present

## 2023-10-30 DIAGNOSIS — Z1211 Encounter for screening for malignant neoplasm of colon: Secondary | ICD-10-CM | POA: Insufficient documentation

## 2023-10-30 HISTORY — PX: COLONOSCOPY: SHX5424

## 2023-10-30 SURGERY — COLONOSCOPY
Anesthesia: General

## 2023-10-30 MED ORDER — PROPOFOL 500 MG/50ML IV EMUL
INTRAVENOUS | Status: DC | PRN
  Administered 2023-10-30: 100 ug/kg/min via INTRAVENOUS
  Administered 2023-10-30: 50 mg via INTRAVENOUS

## 2023-10-30 MED ORDER — SODIUM CHLORIDE 0.9 % IV SOLN: INTRAVENOUS | Status: DC

## 2023-10-30 MED ORDER — LIDOCAINE HCL (CARDIAC) PF 100 MG/5ML IV SOSY
PREFILLED_SYRINGE | INTRAVENOUS | Status: DC | PRN
  Administered 2023-10-30: 60 mg via INTRAVENOUS

## 2023-10-30 NOTE — Interval H&P Note (Signed)
 History and Physical Interval Note:  10/30/2023 11:40 AM  Beth Moore  has presented today for surgery, with the diagnosis of Colon cancer screening (Z12.11).  The various methods of treatment have been discussed with the patient and family. After consideration of risks, benefits and other options for treatment, the patient has consented to  Procedure(s) with comments: COLONOSCOPY (N/A) - Patient needs late morning appointment (LG) as a surgical intervention.  The patient's history has been reviewed, patient examined, no change in status, stable for surgery.  I have reviewed the patient's chart and labs.  Questions were answered to the patient's satisfaction.     [name]  Ok to proceed with colonoscopy

## 2023-10-30 NOTE — H&P (Signed)
 Outpatient short stay form Pre-procedure 10/30/2023  [name], MD  Primary Physician: [facility]  Reason for visit:  Screening colonoscopy  History of present illness:    58 y/o lady with history of hld, hypothyroidism, and hypertensions here for screening colonoscopy. Never had colonoscopy before. No blood thinners. No family history of GI malignancies. No significant abdominal surgeries.    Current Facility-Administered Medications:    0.9 %  sodium chloride  infusion, , Intravenous, Continuous, Ellean Firman, Ole ONEIDA, MD   0.9 %  sodium chloride  infusion, , Intravenous, Continuous, Dequann Vandervelden, Ole ONEIDA, MD, Last Rate: 40 mL/hr at 10/30/23 1004, New Bag at 10/30/23 1004  Medications Prior to Admission  Medication Sig Dispense Refill Last Dose/Taking   albuterol  (VENTOLIN  HFA) 108 (90 Base) MCG/ACT inhaler Inhale 2 puffs into the lungs every 4 (four) hours as needed for wheezing or shortness of breath. 1 each 1 Past Month   ibuprofen  (ADVIL ,MOTRIN ) 800 MG tablet Take 1 tablet (800 mg total) by mouth every 8 (eight) hours as needed. 30 tablet 0 Past Month   montelukast (SINGULAIR) 10 MG tablet Take 1 tablet by mouth at bedtime.  3 Past Month   diltiazem (CARDIZEM CD) 120 MG 24 hr capsule Take 1 capsule by mouth daily. (Patient not taking: Reported on 10/30/2023)  6 Not Taking   lovastatin (MEVACOR) 10 MG tablet Take 10 mg by mouth at bedtime. (Patient not taking: Reported on 10/30/2023)  6 Not Taking   meloxicam (MOBIC) 15 MG tablet Take 1 tablet by mouth as needed. (Patient not taking: Reported on 10/30/2023)  0 Not Taking   ondansetron  (ZOFRAN -ODT) 4 MG disintegrating tablet Take 1 tablet (4 mg total) by mouth every 8 (eight) hours as needed for nausea or vomiting. (Patient not taking: Reported on 10/30/2023) 12 tablet 0 Not Taking   pantoprazole  (PROTONIX ) 40 MG tablet Take 1 tablet (40 mg total) by mouth daily for 14 days. 14 tablet 0    predniSONE  (STERAPRED UNI-PAK 21  TAB) 10 MG (21) TBPK tablet Take 6 tablets on the first day and decrease by 1 tablet each day until finished. (Patient not taking: Reported on 10/30/2023) 21 tablet 0 Not Taking   SYNTHROID 100 MCG tablet Take 1 tablet by mouth every morning.  6 10/27/2023 Morning   traMADol (ULTRAM) 50 MG tablet Take 50 mg by mouth as needed. (Patient not taking: Reported on 10/30/2023)  1 Not Taking     No Known Allergies   Past Medical History:  Diagnosis Date   GERD (gastroesophageal reflux disease)    Hyperlipemia    Hypertension    Thyroid  disease     Review of systems:  Otherwise negative.    Physical Exam  Gen: Alert, oriented. Appears stated age.  HEENT: PERRLA. Lungs: No respiratory distress CV: RRR Abd: soft, benign, no masses Ext: No edema    Planned procedures: Proceed with colonoscopy. The patient understands the nature of the planned procedure, indications, risks, alternatives and potential complications including but not limited to bleeding, infection, perforation, damage to internal organs and possible oversedation/side effects from anesthesia. The patient agrees and gives consent to proceed.  Please refer to procedure notes for findings, recommendations and patient disposition/instructions.     [name], MD [facility]

## 2023-10-30 NOTE — Op Note (Signed)
 [facility] Patient Name: Beth Moore Procedure Date: 10/30/2023 11:35 AM MRN: [id number] Account #: [id number] Date of Birth: 1965/05/27 Admit Type: Outpatient Age: 58 Room: [facility] ENDO ROOM 1 Gender: Female Note Status: Finalized Instrument Name: Peds Colonoscope [id number] Procedure:             Colonoscopy Indications:           Screening for colorectal malignant neoplasm Providers:             Ole Schick MD, MD Referring MD:          [facility], MD (Referring MD) Medicines:             Monitored Anesthesia Care Complications:         No immediate complications. Procedure:             Pre-Anesthesia Assessment:                        - Prior to the procedure, a History and Physical was                         performed, and patient medications and allergies were                         reviewed. The patient is competent. The risks and                         benefits of the procedure and the sedation options and                         risks were discussed with the patient. All questions                         were answered and informed consent was obtained.                         Patient identification and proposed procedure were                         verified by the physician, the nurse, the                         anesthesiologist, the anesthetist and the technician                         in the endoscopy suite. Mental Status Examination:                         alert and oriented. Airway Examination: normal                         oropharyngeal airway and neck mobility. Respiratory                         Examination: clear to auscultation. CV Examination:                         normal. Prophylactic Antibiotics: The patient does not  require prophylactic antibiotics. Prior                         Anticoagulants: The patient has taken no anticoagulant                         or  antiplatelet agents. ASA Grade Assessment: II - A                         patient with mild systemic disease. After reviewing                         the risks and benefits, the patient was deemed in                         satisfactory condition to undergo the procedure. The                         anesthesia plan was to use monitored anesthesia care                         (MAC). Immediately prior to administration of                         medications, the patient was re-assessed for adequacy                         to receive sedatives. The heart rate, respiratory                         rate, oxygen saturations, blood pressure, adequacy of                         pulmonary ventilation, and response to care were                         monitored throughout the procedure. The physical                         status of the patient was re-assessed after the                         procedure.                        After obtaining informed consent, the colonoscope was                         passed under direct vision. Throughout the procedure,                         the patient's blood pressure, pulse, and oxygen                         saturations were monitored continuously. The                         Colonoscope was introduced through the anus and  advanced to the the cecum, identified by appendiceal                         orifice and ileocecal valve. The colonoscopy was                         performed without difficulty. The patient tolerated                         the procedure well. The quality of the bowel                         preparation was inadequate. The ileocecal valve,                         appendiceal orifice, and rectum were photographed. Findings:      The perianal and digital rectal examinations were normal.      Internal hemorrhoids were found during retroflexion. The hemorrhoids       were Grade II (internal hemorrhoids that prolapse  but reduce       spontaneously).      The exam was otherwise without abnormality on direct and retroflexion       views. Impression:            - Preparation of the colon was inadequate.                        - Internal hemorrhoids.                        - The examination was otherwise normal on direct and                         retroflexion views.                        - No specimens collected. Recommendation:        - Discharge patient to home.                        - Resume previous diet.                        - Continue present medications.                        - Repeat colonoscopy in 1 year because the bowel                         preparation was suboptimal.                        - Return to referring physician as previously                         scheduled. Procedure Code(s):     --- Professional ---                        H9878, Colorectal cancer screening; colonoscopy on  individual not meeting criteria for high risk Diagnosis Code(s):     --- Professional ---                        Z12.11, Encounter for screening for malignant neoplasm                         of colon                        K64.1, Second degree hemorrhoids CPT copyright 2022 American [employer] Association. All rights reserved. The codes documented in this report are preliminary and upon coder review may  be revised to meet current compliance requirements. Ole Schick MD, MD 10/30/2023 12:07:23 PM Number of Addenda: 0 Note Initiated On: 10/30/2023 11:35 AM Scope Withdrawal Time: 0 hours 5 minutes 1 second  Total Procedure Duration: 0 hours 10 minutes 51 seconds  Estimated Blood Loss:  Estimated blood loss: none.      [facility]

## 2023-10-30 NOTE — Anesthesia Preprocedure Evaluation (Addendum)
 Anesthesia Evaluation  Patient identified by MRN, date of birth, ID band Patient awake    Reviewed: Allergy & Precautions, NPO status , Patient's Chart, lab work & pertinent test results  History of Anesthesia Complications Negative for: history of anesthetic complications  Airway Mallampati: I  TM Distance: >3 FB Neck ROM: full    Dental  (+) Edentulous Upper, Edentulous Lower   Pulmonary COPD,  COPD inhaler, Current Smoker   Pulmonary exam normal        Cardiovascular hypertension, On Medications negative cardio ROS Normal cardiovascular exam     Neuro/Psych negative neurological ROS  negative psych ROS   GI/Hepatic negative GI ROS, Neg liver ROS,GERD  ,,  Endo/Other  Hypothyroidism    Renal/GU negative Renal ROS  negative genitourinary   Musculoskeletal   Abdominal   Peds  Hematology negative hematology ROS (+)   Anesthesia Other Findings Past Medical History: No date: GERD (gastroesophageal reflux disease) No date: Hyperlipemia No date: Hypertension No date: Thyroid  disease  No past surgical history on file.     Reproductive/Obstetrics negative OB ROS                             Anesthesia Physical Anesthesia Plan  ASA: 2  Anesthesia Plan: General   Post-op Pain Management: Minimal or no pain anticipated   Induction: Intravenous  PONV Risk Score and Plan: 2 and Propofol infusion and TIVA  Airway Management Planned: Natural Airway and Nasal Cannula  Additional Equipment:   Intra-op Plan:   Post-operative Plan:   Informed Consent: I have reviewed the patients History and Physical, chart, labs and discussed the procedure including the risks, benefits and alternatives for the proposed anesthesia with the patient or authorized representative who has indicated his/her understanding and acceptance.     Dental Advisory Given  Plan Discussed with: Anesthesiologist,  CRNA and Surgeon  Anesthesia Plan Comments: (Patient consented for risks of anesthesia including but not limited to:  - adverse reactions to medications - risk of airway placement if required - damage to eyes, teeth, lips or other oral mucosa - nerve damage due to positioning  - sore throat or hoarseness - Damage to heart, brain, nerves, lungs, other parts of body or loss of life  Patient voiced understanding and assent.)       Anesthesia Quick Evaluation

## 2023-10-30 NOTE — Anesthesia Postprocedure Evaluation (Signed)
 Anesthesia Post Note  Patient: Beth Moore  Procedure(s) Performed: COLONOSCOPY  Patient location during evaluation: Endoscopy Anesthesia Type: General Level of consciousness: awake and alert Pain management: pain level controlled Vital Signs Assessment: post-procedure vital signs reviewed and stable Respiratory status: spontaneous breathing, nonlabored ventilation, respiratory function stable and patient connected to nasal cannula oxygen Cardiovascular status: blood pressure returned to baseline and stable Postop Assessment: no apparent nausea or vomiting Anesthetic complications: no   No notable events documented.   Last Vitals:  Vitals:   10/30/23 1221 10/30/23 1226  BP: (!) 143/85   Pulse: (!) 29 (!) 53  Resp: (!) 24 (!) 23  Temp:    SpO2: (!) 87% 98%    Last Pain:  Vitals:   10/30/23 1212  TempSrc:   PainSc: 0-No pain                 Lendia LITTIE Mae

## 2023-10-30 NOTE — Transfer of Care (Signed)
 Immediate Anesthesia Transfer of Care Note  Patient: Beth Moore  Procedure(s) Performed: COLONOSCOPY  Patient Location: Endoscopy Unit  Anesthesia Type:General  Level of Consciousness: drowsy and patient cooperative  Airway & Oxygen Therapy: Patient Spontanous Breathing  Post-op Assessment: Report given to RN and Post -op Vital signs reviewed and stable  Post vital signs: Reviewed and stable  Last Vitals:  Vitals Value Taken Time  BP 121/77 10/30/23 12:06  Temp 35.6 C 10/30/23 12:06  Pulse 55 10/30/23 12:07  Resp 20 10/30/23 12:07  SpO2 100 % 10/30/23 12:07  Vitals shown include unfiled device data.  Last Pain:  Vitals:   10/30/23 1206  TempSrc: Temporal  PainSc: Asleep         Complications: No notable events documented.

## 2023-10-30 NOTE — Anesthesia Procedure Notes (Signed)
 Procedure Name: MAC Date/Time: 10/30/2023 11:47 AM  Performed by: Lorrene Camelia LABOR, CRNAPre-anesthesia Checklist: Patient identified Patient Re-evaluated:Patient Re-evaluated prior to induction Oxygen Delivery Method: Simple face mask Preoxygenation: Pre-oxygenation with 100% oxygen Induction Type: IV induction Comments: POM

## 2024-01-13 ENCOUNTER — Emergency Department
Admission: EM | Admit: 2024-01-13 | Discharge: 2024-01-13 | Disposition: A | Discharge status: Home / Self Care | Attending: Emergency Medicine | Admitting: Emergency Medicine

## 2024-01-13 ENCOUNTER — Emergency Department

## 2024-01-13 ENCOUNTER — Other Ambulatory Visit: Payer: Self-pay

## 2024-01-13 DIAGNOSIS — R0789 Other chest pain: Secondary | ICD-10-CM | POA: Insufficient documentation

## 2024-01-13 LAB — BASIC METABOLIC PANEL WITH GFR
Anion gap: 9 (ref 5–15)
BUN: 15 mg/dL (ref 6–20)
CO2: 26 mmol/L (ref 22–32)
Calcium: 9.7 mg/dL (ref 8.9–10.3)
Chloride: 104 mmol/L (ref 98–111)
Creatinine, Ser: 0.84 mg/dL (ref 0.44–1.00)
GFR, Estimated: >60 mL/min (ref >60)
Glucose, Bld: 103 mg/dL — ABNORMAL HIGH (ref 70–99)
Potassium: 3.4 mmol/L — ABNORMAL LOW (ref 3.5–5.1)
Sodium: 139 mmol/L (ref 135–145)

## 2024-01-13 LAB — CBC
HCT: 44.7 % (ref 36.0–46.0)
Hemoglobin: 14.7 g/dL (ref 12.0–15.0)
MCH: 30.9 pg (ref 26.0–34.0)
MCHC: 32.9 g/dL (ref 30.0–36.0)
MCV: 94.1 fL (ref 80.0–100.0)
Platelets: 263 K/uL (ref 150–400)
RBC: 4.75 MIL/uL (ref 3.87–5.11)
RDW: 11.8 % (ref 11.5–15.5)
WBC: 12.4 K/uL — ABNORMAL HIGH (ref 4.0–10.5)
nRBC: 0 % (ref 0.0–0.2)

## 2024-01-13 LAB — TROPONIN I (HIGH SENSITIVITY)
Troponin I (High Sensitivity): 13 ng/L (ref <18)
Troponin I (High Sensitivity): 13 ng/L (ref <18)

## 2024-01-13 MED ORDER — KETOROLAC TROMETHAMINE 15 MG/ML IJ SOLN
15.0000 mg | Freq: Once | INTRAMUSCULAR | Status: AC
  Administered 2024-01-13: 15 mg via INTRAVENOUS
  Filled 2024-01-13: qty 1

## 2024-01-13 MED ORDER — IPRATROPIUM-ALBUTEROL 0.5-2.5 (3) MG/3ML IN SOLN
3.0000 mL | Freq: Once | RESPIRATORY_TRACT | Status: AC
  Administered 2024-01-13: 3 mL via RESPIRATORY_TRACT
  Filled 2024-01-13: qty 3

## 2024-01-13 NOTE — ED Provider Notes (Signed)
 [facility] Provider Note    Event Date/Time   First MD Initiated Contact with Patient 01/13/24 1615     (approximate)   History   Chest Pain   HPI  Latish Toutant is a 58 y.o. female who presents to the emergency department today because of concerns for left shoulder and neck and chest pain.  Symptoms started today when she was washing dishes.  The pain has ebbed since it started although waxes and wanes in its intensity.  She does complain of some shortness of breath although she was recently diagnosed with bronchitis.  She denies similar pain in the past.     Physical Exam   Triage Vital Signs: ED Triage Vitals  Encounter Vitals Group     BP 01/13/24 1355 (!) 200/114     Girls Systolic BP Percentile --      Girls Diastolic BP Percentile --      Boys Systolic BP Percentile --      Boys Diastolic BP Percentile --      Pulse Rate 01/13/24 1355 73     Resp 01/13/24 1355 20     Temp 01/13/24 1355 98.3 F (36.8 C)     Temp Source 01/13/24 1355 Oral     SpO2 01/13/24 1355 99 %     Weight 01/13/24 1352 95 lb (43.1 kg)     Height 01/13/24 1352 5' 3 (1.6 m)     Head Circumference --      Peak Flow --      Pain Score 01/13/24 1351 8     Pain Loc --      Pain Education --      Exclude from Growth Chart --     Most recent vital signs: Vitals:   01/13/24 1355 01/13/24 1613  BP: (!) 200/114 (!) 194/110  Pulse: 73 66  Resp: 20 18  Temp: 98.3 F (36.8 C) 97.8 F (36.6 C)  SpO2: 99% 100%    General: Awake, alert, oriented. CV:  Good peripheral perfusion. Regular rate and rhythm. Resp:  Normal effort. Lungs clear. Abd:  No distention. Non tender.   ED Results / Procedures / Treatments   Labs (all labs ordered are listed, but only abnormal results are displayed) Labs Reviewed  BASIC METABOLIC PANEL WITH GFR - Abnormal; Notable for the following components:      Result Value   Potassium 3.4 (*)    Glucose, Bld 103 (*)    All other  components within normal limits  CBC - Abnormal; Notable for the following components:   WBC 12.4 (*)    All other components within normal limits  TROPONIN I (HIGH SENSITIVITY)  TROPONIN I (HIGH SENSITIVITY)     EKG  I, Guadalupe Eagles, attending physician, personally viewed and interpreted this EKG  EKG Time: 1355 Rate: 79 Rhythm: sinus rhythm with PVC Axis: normal Intervals: qtc 460 QRS: q waves II, III aVF ST changes: no st elevation Impression: abnormal ekg   RADIOLOGY I independently interpreted and visualized the CXR. My interpretation: No pneumonia Radiology interpretation:  IMPRESSION:  1.  Emphysema (ICD10-J43.9).  2. No acute radiographic findings.    PROCEDURES:  Critical Care performed: No    MEDICATIONS ORDERED IN ED: Medications - No data to display   IMPRESSION / MDM / ASSESSMENT AND PLAN / ED COURSE  I reviewed the triage vital signs and the nursing notes.  Differential diagnosis includes, but is not limited to, ACS, pneumonia, PE, dissection, MSK pain  Patient's presentation is most consistent with acute presentation with potential threat to life or bodily function.   The patient is on the cardiac monitor to evaluate for evidence of arrhythmia and/or significant heart rate changes.  Patient presented to the emergency department today because of concerns for left shoulder, chest neck pain.  On exam patient's lungs are clear.  Good radial pulse to the left upper extremity.  No deformity.  Chest x-ray without pneumonia.  Troponin negative x 2.  EKG without ST elevation.  At this time I do wonder if patient suffering from pinched nerve or musculoskeletal cause of the pain.  I discussed this with the patient.  Will give Toradol  and DuoNeb given history of bronchitis to see if the patient can be made to feel better.  Patient felt slightly improved after medication.  At this time I do think likely musculoskeletal or nerve  cause of the patient's discomfort.  Encourage patient to use Tylenol and ibuprofen .    FINAL CLINICAL IMPRESSION(S) / ED DIAGNOSES   Final diagnoses:  Atypical chest pain    Note:  This document was prepared using Dragon voice recognition software and may include unintentional dictation errors.    Floy Roberts, MD 01/13/24 TRENNA

## 2024-01-13 NOTE — ED Triage Notes (Signed)
 Pt to ED from Home for CP. Pt stating pain starting in neck and travels down left arm and into back. Pt stating pain has progressively gotten worse, started when she was washing dishes about an hour ago. Pt endorses some SOB.

## 2024-01-14 ENCOUNTER — Observation Stay
Admission: EM | Admit: 2024-01-14 | Discharge: 2024-01-15 | Disposition: A | Discharge status: Home / Self Care | Attending: Hospitalist | Admitting: Hospitalist

## 2024-01-14 ENCOUNTER — Emergency Department

## 2024-01-14 ENCOUNTER — Other Ambulatory Visit: Payer: Self-pay

## 2024-01-14 DIAGNOSIS — J449 Chronic obstructive pulmonary disease, unspecified: Secondary | ICD-10-CM | POA: Diagnosis not present

## 2024-01-14 DIAGNOSIS — J9383 Other pneumothorax: Secondary | ICD-10-CM | POA: Diagnosis not present

## 2024-01-14 DIAGNOSIS — R636 Underweight: Secondary | ICD-10-CM | POA: Insufficient documentation

## 2024-01-14 DIAGNOSIS — R079 Chest pain, unspecified: Secondary | ICD-10-CM

## 2024-01-14 DIAGNOSIS — Z681 Body mass index (BMI) 19 or less, adult: Secondary | ICD-10-CM

## 2024-01-14 DIAGNOSIS — F17200 Nicotine dependence, unspecified, uncomplicated: Secondary | ICD-10-CM

## 2024-01-14 DIAGNOSIS — D72829 Elevated white blood cell count, unspecified: Secondary | ICD-10-CM | POA: Insufficient documentation

## 2024-01-14 DIAGNOSIS — I10 Essential (primary) hypertension: Secondary | ICD-10-CM | POA: Insufficient documentation

## 2024-01-14 DIAGNOSIS — J939 Pneumothorax, unspecified: Secondary | ICD-10-CM

## 2024-01-14 DIAGNOSIS — I160 Hypertensive urgency: Secondary | ICD-10-CM | POA: Diagnosis not present

## 2024-01-14 DIAGNOSIS — Z638 Other specified problems related to primary support group: Secondary | ICD-10-CM

## 2024-01-14 DIAGNOSIS — F17210 Nicotine dependence, cigarettes, uncomplicated: Secondary | ICD-10-CM | POA: Insufficient documentation

## 2024-01-14 DIAGNOSIS — R0789 Other chest pain: Secondary | ICD-10-CM | POA: Diagnosis present

## 2024-01-14 LAB — CBC
HCT: 42.7 % (ref 36.0–46.0)
Hemoglobin: 14.1 g/dL (ref 12.0–15.0)
MCH: 30.9 pg (ref 26.0–34.0)
MCHC: 33 g/dL (ref 30.0–36.0)
MCV: 93.6 fL (ref 80.0–100.0)
Platelets: 230 K/uL (ref 150–400)
RBC: 4.56 MIL/uL (ref 3.87–5.11)
RDW: 12 % (ref 11.5–15.5)
WBC: 10.9 K/uL — ABNORMAL HIGH (ref 4.0–10.5)
nRBC: 0 % (ref 0.0–0.2)

## 2024-01-14 LAB — BASIC METABOLIC PANEL WITH GFR
Anion gap: 11 (ref 5–15)
BUN: 14 mg/dL (ref 6–20)
CO2: 27 mmol/L (ref 22–32)
Calcium: 9.1 mg/dL (ref 8.9–10.3)
Chloride: 104 mmol/L (ref 98–111)
Creatinine, Ser: 0.71 mg/dL (ref 0.44–1.00)
GFR, Estimated: >60 mL/min (ref >60)
Glucose, Bld: 91 mg/dL (ref 70–99)
Potassium: 3.4 mmol/L — ABNORMAL LOW (ref 3.5–5.1)
Sodium: 142 mmol/L (ref 135–145)

## 2024-01-14 LAB — TROPONIN I (HIGH SENSITIVITY): Troponin I (High Sensitivity): 16 ng/L (ref <18)

## 2024-01-14 MED ORDER — ONDANSETRON HCL 4 MG/2ML IJ SOLN: 4.0000 mg | Freq: Four times a day (QID) | INTRAMUSCULAR | Status: DC | PRN

## 2024-01-14 MED ORDER — ALBUTEROL SULFATE (2.5 MG/3ML) 0.083% IN NEBU: 2.5000 mg | INHALATION_SOLUTION | RESPIRATORY_TRACT | Status: DC | PRN

## 2024-01-14 MED ORDER — ACETAMINOPHEN 650 MG RE SUPP: 650.0000 mg | Freq: Four times a day (QID) | RECTAL | Status: DC | PRN

## 2024-01-14 MED ORDER — ACETAMINOPHEN 325 MG PO TABS: 650.0000 mg | ORAL_TABLET | Freq: Four times a day (QID) | ORAL | Status: DC | PRN

## 2024-01-14 MED ORDER — LISINOPRIL 10 MG PO TABS
10.0000 mg | ORAL_TABLET | Freq: Every day | ORAL | Status: DC
  Administered 2024-01-14 – 2024-01-15 (×2): 10 mg via ORAL
  Filled 2024-01-14 (×2): qty 1

## 2024-01-14 MED ORDER — ENOXAPARIN SODIUM 30 MG/0.3ML IJ SOSY: 30.0000 mg | PREFILLED_SYRINGE | INTRAMUSCULAR | Status: DC

## 2024-01-14 MED ORDER — ONDANSETRON HCL 4 MG PO TABS: 4.0000 mg | ORAL_TABLET | Freq: Four times a day (QID) | ORAL | Status: DC | PRN

## 2024-01-14 MED ORDER — HYDRALAZINE HCL 20 MG/ML IJ SOLN: 5.0000 mg | INTRAMUSCULAR | Status: DC | PRN

## 2024-01-14 MED ORDER — MORPHINE SULFATE (PF) 4 MG/ML IV SOLN: 4.0000 mg | Freq: Once | INTRAVENOUS | Status: DC

## 2024-01-14 MED ORDER — KETOROLAC TROMETHAMINE 15 MG/ML IJ SOLN
15.0000 mg | Freq: Once | INTRAMUSCULAR | Status: AC
  Administered 2024-01-14: 15 mg via INTRAVENOUS
  Filled 2024-01-14: qty 1

## 2024-01-14 MED ORDER — MORPHINE SULFATE (PF) 4 MG/ML IV SOLN
4.0000 mg | Freq: Once | INTRAVENOUS | Status: AC | PRN
Start: 2024-01-14 — End: 2024-01-15
  Administered 2024-01-15: 4 mg via INTRAVENOUS
  Filled 2024-01-14: qty 1
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
## 2024-01-14 NOTE — Assessment & Plan Note (Deleted)
 Asthma/COPD Possible spontaneous pneumothorax related to bleb Continue NRB overnight Pain control Chest x-ray in the a.m. Can consider pulmonology consult Albuterol  as needed for any wheezing

## 2024-01-14 NOTE — Assessment & Plan Note (Signed)
 Remote history of diltiazem use Will start lisinopril 
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
## 2024-01-15 ENCOUNTER — Other Ambulatory Visit: Payer: Self-pay

## 2024-01-15 ENCOUNTER — Observation Stay

## 2024-01-15 DIAGNOSIS — Z638 Other specified problems related to primary support group: Secondary | ICD-10-CM

## 2024-01-15 DIAGNOSIS — F17200 Nicotine dependence, unspecified, uncomplicated: Secondary | ICD-10-CM

## 2024-01-15 DIAGNOSIS — Z681 Body mass index (BMI) 19 or less, adult: Secondary | ICD-10-CM

## 2024-01-15 DIAGNOSIS — J939 Pneumothorax, unspecified: Secondary | ICD-10-CM | POA: Diagnosis not present

## 2024-01-15 LAB — HIV ANTIBODY (ROUTINE TESTING W REFLEX): HIV Screen 4th Generation wRfx: NONREACTIVE

## 2024-01-15 MED ORDER — NICOTINE 14 MG/24HR TD PT24
14.0000 mg | MEDICATED_PATCH | Freq: Every day | TRANSDERMAL | 0 refills | Status: DC
  Filled 2024-01-15: qty 28, 28d supply, fill #0

## 2024-01-15 MED ORDER — KETOROLAC TROMETHAMINE 10 MG PO TABS
10.0000 mg | ORAL_TABLET | Freq: Four times a day (QID) | ORAL | 0 refills | Status: AC | PRN
  Filled 2024-01-15: qty 20, 5d supply, fill #0

## 2024-01-15 MED ORDER — NICOTINE 14 MG/24HR TD PT24: 14.0000 mg | MEDICATED_PATCH | Freq: Every day | TRANSDERMAL | Status: DC

## 2024-01-15 MED ORDER — LISINOPRIL 20 MG PO TABS
20.0000 mg | ORAL_TABLET | Freq: Every day | ORAL | 2 refills | Status: DC
  Filled 2024-01-15: qty 30, 30d supply, fill #0
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
## 2024-01-15 NOTE — Discharge Instructions (Signed)

## 2024-01-15 NOTE — Progress Notes (Incomplete)
 {  Select_TRH_Note:[id number]}
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
## 2024-01-15 NOTE — Progress Notes (Signed)
 Pt ambulated around the unit on Room Air with Pulse Oximetry reading 99%. No respiratory distress noted. Pt made agrees with discharge and stated that is she has any other respiratory complications, then she knows to come back to the Emergency Department. MD aware of results

## 2024-01-15 NOTE — Assessment & Plan Note (Signed)
 Smokes 5 to 6 cigarettes daily  nicotine  patch

## 2024-01-15 NOTE — TOC CM/SW Note (Signed)
 Transition of Care [facility]) - Inpatient Brief Assessment   Patient Details  Name: Beth Moore MRN: [id number] Date of Birth: 10/16/1965  Transition of Care [facility]) CM/SW Contact:    Corean ONEIDA Haddock, RN Phone Number: 01/15/2024, 1:50 PM   Clinical Narrative:   Transition of Care [facility]) Screening Note   Patient Details  Name: Beth Moore Date of Birth: 12/06/1965   Transition of Care [facility]) CM/SW Contact:    Corean ONEIDA Haddock, RN Phone Number: 01/15/2024, 1:50 PM    Transition of Care Department [facility]) has reviewed patient and no TOC needs have been identified at this time. . If new patient transition needs arise, please place a TOC consult.   Per SDOH social isolation resources added to AVS   Transition of Care Asessment: Insurance and Status: Insurance coverage has been reviewed Patient has primary care physician: Yes     Prior/Current Home Services: No current home services Social Drivers of Health Review: SDOH reviewed no interventions necessary Readmission risk has been reviewed: Yes Transition of care needs: no transition of care needs at this time
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
## 2024-01-15 NOTE — Plan of Care (Signed)

## 2024-01-15 NOTE — Assessment & Plan Note (Signed)
 Might benefit from Ensure supplements

## 2024-02-02 ENCOUNTER — Inpatient Hospital Stay
Admit: 2024-02-02 | Discharge: 2024-02-02 | Disposition: A | Discharge status: Home / Self Care | Attending: Internal Medicine | Admitting: Internal Medicine

## 2024-02-02 ENCOUNTER — Other Ambulatory Visit: Payer: Self-pay

## 2024-02-02 ENCOUNTER — Emergency Department

## 2024-02-02 ENCOUNTER — Encounter: Payer: Self-pay | Admitting: Internal Medicine

## 2024-02-02 ENCOUNTER — Inpatient Hospital Stay
Admission: EM | Admit: 2024-02-02 | Discharge: 2024-02-04 | DRG: 313 | Disposition: A | Discharge status: Home / Self Care | Attending: Internal Medicine | Admitting: Internal Medicine

## 2024-02-02 DIAGNOSIS — R001 Bradycardia, unspecified: Secondary | ICD-10-CM | POA: Diagnosis present

## 2024-02-02 DIAGNOSIS — Z716 Tobacco abuse counseling: Secondary | ICD-10-CM | POA: Diagnosis not present

## 2024-02-02 DIAGNOSIS — E785 Hyperlipidemia, unspecified: Secondary | ICD-10-CM | POA: Diagnosis present

## 2024-02-02 DIAGNOSIS — Z23 Encounter for immunization: Secondary | ICD-10-CM

## 2024-02-02 DIAGNOSIS — E039 Hypothyroidism, unspecified: Secondary | ICD-10-CM | POA: Diagnosis present

## 2024-02-02 DIAGNOSIS — F17210 Nicotine dependence, cigarettes, uncomplicated: Secondary | ICD-10-CM | POA: Diagnosis present

## 2024-02-02 DIAGNOSIS — R079 Chest pain, unspecified: Principal | ICD-10-CM | POA: Diagnosis present

## 2024-02-02 DIAGNOSIS — Z7982 Long term (current) use of aspirin: Secondary | ICD-10-CM | POA: Diagnosis not present

## 2024-02-02 DIAGNOSIS — Z79899 Other long term (current) drug therapy: Secondary | ICD-10-CM

## 2024-02-02 DIAGNOSIS — J439 Emphysema, unspecified: Secondary | ICD-10-CM | POA: Diagnosis present

## 2024-02-02 DIAGNOSIS — Z59868 Other specified financial insecurity: Secondary | ICD-10-CM | POA: Diagnosis not present

## 2024-02-02 DIAGNOSIS — Z604 Social exclusion and rejection: Secondary | ICD-10-CM | POA: Diagnosis present

## 2024-02-02 DIAGNOSIS — Z79890 Hormone replacement therapy (postmenopausal): Secondary | ICD-10-CM | POA: Diagnosis not present

## 2024-02-02 DIAGNOSIS — I10 Essential (primary) hypertension: Secondary | ICD-10-CM | POA: Diagnosis present

## 2024-02-02 DIAGNOSIS — I214 Non-ST elevation (NSTEMI) myocardial infarction: Principal | ICD-10-CM

## 2024-02-02 DIAGNOSIS — K219 Gastro-esophageal reflux disease without esophagitis: Secondary | ICD-10-CM | POA: Diagnosis present

## 2024-02-02 LAB — TROPONIN I (HIGH SENSITIVITY)
Troponin I (High Sensitivity): 54 ng/L — ABNORMAL HIGH (ref <18)
Troponin I (High Sensitivity): 146 ng/L
Troponin I (High Sensitivity): 236 ng/L (ref <18)

## 2024-02-02 LAB — HEPARIN LEVEL (UNFRACTIONATED): Heparin Unfractionated: 0.14 [IU]/mL — ABNORMAL LOW (ref 0.30–0.70)

## 2024-02-02 LAB — COMPREHENSIVE METABOLIC PANEL WITH GFR
ALT: 17 U/L (ref 0–44)
AST: 26 U/L (ref 15–41)
Albumin: 4.8 g/dL (ref 3.5–5.0)
Alkaline Phosphatase: 54 U/L (ref 38–126)
Anion gap: 16 — ABNORMAL HIGH (ref 5–15)
BUN: 15 mg/dL (ref 6–20)
CO2: 26 mmol/L (ref 22–32)
Calcium: 9.9 mg/dL (ref 8.9–10.3)
Chloride: 97 mmol/L — ABNORMAL LOW (ref 98–111)
Creatinine, Ser: 0.78 mg/dL (ref 0.44–1.00)
GFR, Estimated: >60 mL/min (ref >60)
Glucose, Bld: 91 mg/dL (ref 70–99)
Potassium: 4 mmol/L (ref 3.5–5.1)
Sodium: 139 mmol/L (ref 135–145)
Total Bilirubin: 1.9 mg/dL — ABNORMAL HIGH (ref 0.0–1.2)
Total Protein: 8.6 g/dL — ABNORMAL HIGH (ref 6.5–8.1)

## 2024-02-02 LAB — PROTIME-INR
INR: 0.9 (ref 0.8–1.2)
Prothrombin Time: 12.8 s (ref 11.4–15.2)

## 2024-02-02 LAB — CBC
HCT: 51.5 % — ABNORMAL HIGH (ref 36.0–46.0)
Hemoglobin: 16.6 g/dL — ABNORMAL HIGH (ref 12.0–15.0)
MCH: 30.3 pg (ref 26.0–34.0)
MCHC: 32.2 g/dL (ref 30.0–36.0)
MCV: 94 fL (ref 80.0–100.0)
Platelets: 269 K/uL (ref 150–400)
RBC: 5.48 MIL/uL — ABNORMAL HIGH (ref 3.87–5.11)
RDW: 11.9 % (ref 11.5–15.5)
WBC: 9.7 K/uL (ref 4.0–10.5)
nRBC: 0 % (ref 0.0–0.2)

## 2024-02-02 LAB — CBG MONITORING, ED: Glucose-Capillary: 196 mg/dL — ABNORMAL HIGH (ref 70–99)

## 2024-02-02 LAB — APTT: aPTT: 28 s (ref 24–36)

## 2024-02-02 LAB — TSH: TSH: 5.851 u[IU]/mL — ABNORMAL HIGH (ref 0.350–4.500)

## 2024-02-02 LAB — LIPASE, BLOOD: Lipase: 70 U/L — ABNORMAL HIGH (ref 11–51)

## 2024-02-02 LAB — T4, FREE: Free T4: 0.8 ng/dL (ref 0.61–1.12)

## 2024-02-02 MED ORDER — HEPARIN (PORCINE) 25000 UT/250ML-% IV SOLN
850.0000 [IU]/h | INTRAVENOUS | Status: DC
  Administered 2024-02-02: 550 [IU]/h via INTRAVENOUS
  Administered 2024-02-03: 750 [IU]/h via INTRAVENOUS
  Filled 2024-02-02 (×2): qty 250

## 2024-02-02 MED ORDER — PNEUMOCOCCAL 20-VAL CONJ VACC 0.5 ML IM SUSY
0.5000 mL | PREFILLED_SYRINGE | INTRAMUSCULAR | Status: DC
Start: 2024-02-03 — End: 2024-02-04
  Filled 2024-02-02: qty 0.5

## 2024-02-02 MED ORDER — HEPARIN BOLUS VIA INFUSION
2700.0000 [IU] | Freq: Once | INTRAVENOUS | Status: AC
  Administered 2024-02-02: 2700 [IU] via INTRAVENOUS
  Filled 2024-02-02: qty 2700

## 2024-02-02 MED ORDER — ASPIRIN 81 MG PO CHEW: 324.0000 mg | CHEWABLE_TABLET | ORAL | Status: DC

## 2024-02-02 MED ORDER — ONDANSETRON HCL 4 MG/2ML IJ SOLN: 4.0000 mg | Freq: Four times a day (QID) | INTRAMUSCULAR | Status: DC | PRN

## 2024-02-02 MED ORDER — NITROGLYCERIN 0.4 MG SL SUBL
0.4000 mg | SUBLINGUAL_TABLET | SUBLINGUAL | Status: DC | PRN
  Administered 2024-02-02 – 2024-02-04 (×5): 0.4 mg via SUBLINGUAL
  Filled 2024-02-02: qty 1

## 2024-02-02 MED ORDER — ASPIRIN 81 MG PO CHEW
324.0000 mg | CHEWABLE_TABLET | Freq: Once | ORAL | Status: AC
  Administered 2024-02-02: 324 mg via ORAL
  Filled 2024-02-02: qty 4

## 2024-02-02 MED ORDER — INFLUENZA VIRUS VACC SPLIT PF (FLUZONE) 0.5 ML IM SUSY
0.5000 mL | PREFILLED_SYRINGE | INTRAMUSCULAR | Status: AC
  Administered 2024-02-03: 0.5 mL via INTRAMUSCULAR
  Filled 2024-02-02: qty 0.5

## 2024-02-02 MED ORDER — HYDROXYZINE HCL 25 MG PO TABS: 25.0000 mg | ORAL_TABLET | Freq: Four times a day (QID) | ORAL | Status: DC | PRN

## 2024-02-02 MED ORDER — IOHEXOL 350 MG/ML SOLN
100.0000 mL | Freq: Once | INTRAVENOUS | Status: AC | PRN
  Administered 2024-02-02: 100 mL via INTRAVENOUS

## 2024-02-02 MED ORDER — ALBUTEROL SULFATE (2.5 MG/3ML) 0.083% IN NEBU: 3.0000 mL | INHALATION_SOLUTION | RESPIRATORY_TRACT | Status: DC | PRN

## 2024-02-02 MED ORDER — ASPIRIN 300 MG RE SUPP: 300.0000 mg | RECTAL | Status: DC

## 2024-02-02 MED ORDER — FLUTICASONE FUROATE-VILANTEROL 100-25 MCG/ACT IN AEPB
1.0000 | INHALATION_SPRAY | Freq: Every day | RESPIRATORY_TRACT | Status: DC
Start: 2024-02-02 — End: 2024-02-04
  Administered 2024-02-02 – 2024-02-04 (×3): 1 via RESPIRATORY_TRACT
  Filled 2024-02-02: qty 28

## 2024-02-02 MED ORDER — ASPIRIN 81 MG PO TBEC
81.0000 mg | DELAYED_RELEASE_TABLET | Freq: Every day | ORAL | Status: DC
  Administered 2024-02-03 – 2024-02-04 (×2): 81 mg via ORAL
  Filled 2024-02-02 (×2): qty 1

## 2024-02-02 MED ORDER — ACETAMINOPHEN 325 MG PO TABS
650.0000 mg | ORAL_TABLET | ORAL | Status: DC | PRN
  Administered 2024-02-04: 650 mg via ORAL
  Filled 2024-02-02: qty 2

## 2024-02-02 MED ORDER — LISINOPRIL 20 MG PO TABS
20.0000 mg | ORAL_TABLET | Freq: Every day | ORAL | Status: DC
Start: 2024-02-02 — End: 2024-02-04
  Administered 2024-02-02 – 2024-02-04 (×3): 20 mg via ORAL
  Filled 2024-02-02: qty 1
  Filled 2024-02-02 (×2): qty 2

## 2024-02-02 MED ORDER — TIOTROPIUM BROMIDE MONOHYDRATE 18 MCG IN CAPS: 1.0000 | ORAL_CAPSULE | Freq: Every day | RESPIRATORY_TRACT | Status: DC

## 2024-02-02 MED ORDER — LEVOTHYROXINE SODIUM 88 MCG PO TABS
88.0000 ug | ORAL_TABLET | Freq: Every day | ORAL | Status: DC
  Administered 2024-02-02 – 2024-02-04 (×3): 88 ug via ORAL
  Filled 2024-02-02 (×3): qty 1

## 2024-02-02 MED ORDER — HEPARIN BOLUS VIA INFUSION
1300.0000 [IU] | Freq: Once | INTRAVENOUS | Status: AC
  Administered 2024-02-02: 1300 [IU] via INTRAVENOUS
  Filled 2024-02-02: qty 1300

## 2024-02-02 NOTE — Consult Note (Signed)
 PHARMACY - ANTICOAGULATION CONSULT NOTE  Pharmacy Consult for heparin infusion Indication: chest pain/ACS  No Known Allergies  Patient Measurements: Height: 5' 3 (160 cm) Weight: 45.5 kg (100 lb 5 oz) IBW/kg (Calculated) : 52.4 HEPARIN DW (KG): 45.5  Vital Signs: Temp: 98.4 F (36.9 C) (10/04 2026) Temp Source: Oral (10/04 0953) BP: 115/68 (10/04 2026) Pulse Rate: 63 (10/04 2026)  Labs: Recent Labs    02/02/24 1019 02/02/24 1152 02/02/24 1528 02/02/24 2050  HGB 16.6*  --   --   --   HCT 51.5*  --   --   --   PLT 269  --   --   --   APTT 28  --   --   --   LABPROT 12.8  --   --   --   INR 0.9  --   --   --   HEPARINUNFRC  --   --   --  0.14*  CREATININE 0.78  --   --   --   TROPONINIHS 54* 146* 236*  --     Estimated Creatinine Clearance: 53.7 mL/min (by C-G formula based on SCr of 0.78 mg/dL).   Medical History: Past Medical History:  Diagnosis Date   GERD (gastroesophageal reflux disease)    Hyperlipemia    Hypertension    Thyroid  disease     Medications:  No home anticoagulants per pharmacist review  Assessment: 58 yo female presented to ED with complaint of chest pain.  Patient found to have elevated troponin.  Pharmacy consulted to initiate heparin infusion.  Baseline aPTT and PT/INR ordered.  Date Time HL Rate/Comment 10/05 2050 0.14 SUBtherapeutic  Goal of Therapy:  Heparin level 0.3-0.7 units/ml Monitor platelets by anticoagulation protocol: Yes   Plan:  Give heparin bolus of 1300 units x1 Increase heparin infusion rate to 650 units/hour Check heparin level in 6 hours Monitor CBC and signs/symptoms of bleeding  Thank you for involving pharmacy in this patient's care.   Damien Napoleon, PharmD Clinical Pharmacist 02/02/2024 9:51 PM
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
## 2024-02-02 NOTE — Consult Note (Signed)
 PHARMACY - ANTICOAGULATION CONSULT NOTE  Pharmacy Consult for heparin infusion Indication: chest pain/ACS  No Known Allergies  Patient Measurements: Height: 5' 3 (160 cm) Weight: 45.5 kg (100 lb 5 oz) IBW/kg (Calculated) : 52.4 HEPARIN DW (KG): 45.5  Vital Signs: Temp: 97.6 F (36.4 C) (10/04 0953) Temp Source: Oral (10/04 0953) BP: 157/90 (10/04 1300) Pulse Rate: 69 (10/04 1300)  Labs: Recent Labs    02/02/24 1019 02/02/24 1152  HGB 16.6*  --   HCT 51.5*  --   PLT 269  --   CREATININE 0.78  --   TROPONINIHS 54* 146*    Estimated Creatinine Clearance: 53.7 mL/min (by C-G formula based on SCr of 0.78 mg/dL).   Medical History: Past Medical History:  Diagnosis Date   GERD (gastroesophageal reflux disease)    Hyperlipemia    Hypertension    Thyroid  disease     Medications:  No home anticoagulants per pharmacist review  Assessment: 58 yo female presented to ED with complaint of chest pain.  Patient found to have elevated troponin.  Pharmacy consulted to initiate heparin infusion.  Baseline aPTT and PT/INR ordered.  Goal of Therapy:  Heparin level 0.3-0.7 units/ml Monitor platelets by anticoagulation protocol: Yes   Plan:  Give 2700 units bolus x 1 Start heparin infusion at 550 units/hr Check anti-Xa level in 6 hours and daily while on heparin Continue to monitor H&H and platelets  Kayla JULIANNA Blew, PharmD, BCPS 02/02/2024,1:31 PM

## 2024-02-02 NOTE — ED Triage Notes (Signed)
 Pt to ED via POV with c/o headache x 2-3 days. Also c/o of chest pain, nausea, & diaphoresis x 1 hour. Describes chest pain as a pressure; denies any radiation.

## 2024-02-02 NOTE — Consult Note (Signed)
 CARDIOLOGY CONSULT NOTE               Patient ID: Beth Moore MRN: [id number] DOB/AGE: 60-Oct-1967 60 y.o.  Admit date: 02/02/2024 Referring Physician Dr. Cort Mana hospitalist Primary Physician  Primary Cardiologist  Reason for Consultation chest pain  HPI: 58 year old white female smoker presented with chest pain symptoms while at the supermarket 9 out of 10 radiating to her left arm and left 3 fingers tingling sensation numbness sweating feeling nauseous but no vomiting.  The pain is relatively persistent and constant.  No exacerbating factors no previous cardiac history patient states chest pain persisted for about 45 minutes came to the ER EKG in ER was unremarkable initial troponins were low but began to rise so cardiology was consulted for further evaluation  Review of systems complete and found to be negative unless listed above     Past Medical History:  Diagnosis Date   GERD (gastroesophageal reflux disease)    Hyperlipemia    Hypertension    Thyroid  disease     Past Surgical History:  Procedure Laterality Date   COLONOSCOPY N/A 10/30/2023   Procedure: COLONOSCOPY;  Surgeon: Maryruth Ole DASEN, MD;  Location: [facility] ENDOSCOPY;  Service: Endoscopy;  Laterality: N/A;  Patient needs late morning appointment (LG)    Medications Prior to Admission  Medication Sig Dispense Refill Last Dose/Taking   albuterol  (VENTOLIN  HFA) 108 (90 Base) MCG/ACT inhaler Inhale 2 puffs into the lungs every 4 (four) hours as needed for wheezing or shortness of breath. 1 each 1 02/01/2024   hydrOXYzine (ATARAX) 25 MG tablet Take 25 mg by mouth every 6 (six) hours as needed.   Past Month   levothyroxine (SYNTHROID) 88 MCG tablet Take 88 mcg by mouth daily.   02/01/2024   lisinopril  (ZESTRIL ) 20 MG tablet Take 1 tablet (20 mg total) by mouth daily. 30 tablet 2 02/01/2024   nicotine  (NICODERM CQ  - DOSED IN MG/24 HOURS) 14 mg/24hr patch Place 1 patch (14 mg total) onto the skin daily. (Patient not  taking: Reported on 02/02/2024) 28 patch 0 Not Taking   SPIRIVA HANDIHALER 18 MCG inhalation capsule Place 1 capsule into inhaler and inhale daily. (Patient not taking: Reported on 02/02/2024)   Not Taking   Social History   Socioeconomic History   Marital status: Divorced    Spouse name: Not on file   Number of children: Not on file   Years of education: Not on file   Highest education level: Not on file  Occupational History   Not on file  Tobacco Use   Smoking status: Every Day    Current packs/day: 0.50    Types: Cigarettes   Smokeless tobacco: Never  Substance and Sexual Activity   Alcohol use: No   Drug use: No   Sexual activity: Yes  Other Topics Concern   Not on file  Social History Narrative   Not on file   Social Drivers of Health   Financial Resource Strain: High Risk (08/23/2023)   Received from [facility]   Overall Financial Resource Strain (CARDIA)    Difficulty of Paying Living Expenses: Hard  Food Insecurity: No Food Insecurity (02/02/2024)   Hunger Vital Sign    Worried About Running Out of Food in the Last Year: Never true    Ran Out of Food in the Last Year: Never true  Transportation Needs: No Transportation Needs (02/02/2024)   PRAPARE - Administrator, Civil Service (Medical): No  Lack of Transportation (Non-Medical): No  Physical Activity: Not on file  Stress: Not on file  Social Connections: Socially Isolated (01/15/2024)   Social Connection and Isolation Panel    Frequency of Communication with Friends and Family: Three times a week    Frequency of Social Gatherings with Friends and Family: Three times a week    Attends Religious Services: Never    Active Member of Clubs or Organizations: No    Attends Banker Meetings: Never    Marital Status: Divorced  Catering manager Violence: Not At Risk (02/02/2024)   Humiliation, Afraid, Rape, and Kick questionnaire    Fear of Current or Ex-Partner: No     Emotionally Abused: No    Physically Abused: No    Sexually Abused: No    History reviewed. No pertinent family history.    Review of systems complete and found to be negative unless listed above      PHYSICAL EXAM  General: Well developed, well nourished, in no acute distress HEENT:  Normocephalic and atramatic Neck:  No JVD.  Lungs: Clear bilaterally to auscultation and percussion. Heart: HRRR . Normal S1 and S2 without gallops or murmurs.  Abdomen: Bowel sounds are positive, abdomen soft and non-tender  Msk:  Back normal, normal gait. Normal strength and tone for age. Extremities: No clubbing, cyanosis or edema.   Neuro: Alert and oriented X 3. Psych:  Good affect, responds appropriately  Labs:   Lab Results  Component Value Date   WBC 9.7 02/02/2024   HGB 16.6 (H) 02/02/2024   HCT 51.5 (H) 02/02/2024   MCV 94.0 02/02/2024   PLT 269 02/02/2024    Recent Labs  Lab 02/02/24 1019  NA 139  K 4.0  CL 97*  CO2 26  BUN 15  CREATININE 0.78  CALCIUM 9.9  PROT 8.6*  BILITOT 1.9*  ALKPHOS 54  ALT 17  AST 26  GLUCOSE 91   Lab Results  Component Value Date   TROPONINI <0.03 09/30/2014     Radiology: Unremarkable imaging CT chest x-ray  EKG: Normal sinus rhythm rate of 70 nonspecific ST-T wave changes  ASSESSMENT AND PLAN:  Chest pain possible cardiac Elevated troponins Smoking Hypertension COPD . Plan Agree with admit rule out myocardial infarction Continue telemetry EKGs troponins Cardiogram for left ventricular function wall motion Aspirin heparin consider beta-blocker consider nitrates Recommend functional study for further evaluation Advised patient refrain from tobacco abuse Evaluate for lipid status consider statin therapy    Beth JONETTA Lovelace MD 02/02/2024, 7:07 PM

## 2024-02-02 NOTE — H&P (Signed)
 History and Physical    Beth Moore FMW:[id number] DOB: Sep 16, 1965 DOA: 02/02/2024  PCP: [facility] (Confirm with patient/family/NH records and if not entered, this has to be entered at [facility] point of entry) Patient coming from: Home  I have personally briefly reviewed patient's old medical records in [facility]  Chief Complaint: Chest pain  HPI: Beth Moore is a 58 y.o. female with medical history significant of HTN, COPD, cigarette smoking, presented with new onset chest pain.  Symptoms started this morning, patient was at a supermarket, was sudden she started to have severe chest pain 8-9/10 pressure-like radiating to left side of neck and left 3 fingers tingling sensation, along with sweating and feeling nausea but no vomiting.  The chest pain was constant, and she did not notice significant exacerbation or relieving factors.  She decided to go back to her car and drove to the hospital herself.  Chest pain subsided in about 45-50 minutes after she arrived in the ED.  Then she had another episode of chest pain with similar quality while she urinated, and the chest pain this time also subsided by itself.  Last week she had a COPD exacerbation and was treated with antibiotics and steroids which she just completed yesterday.  ED Course: Afebrile, nontachycardic, hypotension not hypoxic.  EKG showed sinus rhythm and no acute ST changes.  Troponin trending 54> 148.  CTA dissection study negative for aortic dissection.  Patient was started on heparin drip.  Review of Systems: As per HPI otherwise 14 point review of systems negative.    Past Medical History:  Diagnosis Date   GERD (gastroesophageal reflux disease)    Hyperlipemia    Hypertension    Thyroid  disease     Past Surgical History:  Procedure Laterality Date   COLONOSCOPY N/A 10/30/2023   Procedure: COLONOSCOPY;  Surgeon: Maryruth Ole DASEN, MD;  Location: [facility] ENDOSCOPY;  Service: Endoscopy;   Laterality: N/A;  Patient needs late morning appointment (LG)     reports that she has been smoking cigarettes. She has never used smokeless tobacco. She reports that she does not drink alcohol and does not use drugs.  No Known Allergies  No family history on file.   Prior to Admission medications   Medication Sig Start Date End Date Taking? Authorizing Provider  levothyroxine (SYNTHROID) 88 MCG tablet Take 88 mcg by mouth daily. 01/17/24  Yes [provider]  SPIRIVA HANDIHALER 18 MCG inhalation capsule Place 1 capsule into inhaler and inhale daily. 01/18/24  Yes [provider]  albuterol  (VENTOLIN  HFA) 108 (90 Base) MCG/ACT inhaler Inhale 2 puffs into the lungs every 4 (four) hours as needed for wheezing or shortness of breath. 08/26/23   Triplett, Cari B, FNP  hydrOXYzine (ATARAX) 25 MG tablet Take 25 mg by mouth every 6 (six) hours as needed. 12/06/23   [provider]  lisinopril  (ZESTRIL ) 20 MG tablet Take 1 tablet (20 mg total) by mouth daily. 01/16/24   Awanda City, MD  nicotine  (NICODERM CQ  - DOSED IN MG/24 HOURS) 14 mg/24hr patch Place 1 patch (14 mg total) onto the skin daily. 01/16/24   Awanda City, MD    Physical Exam: Vitals:   02/02/24 0953 02/02/24 1130 02/02/24 1230 02/02/24 1300  BP: (!) 152/82 112/77 129/67 (!) 157/90  Pulse: 64 (!) 56 (!) 55 69  Resp: 20 20 (!) 21 20  Temp: 97.6 F (36.4 C)     TempSrc: Oral     SpO2: 100% 97%  100% 100%  Weight:      Height:        Constitutional: NAD, calm, comfortable Vitals:   02/02/24 0953 02/02/24 1130 02/02/24 1230 02/02/24 1300  BP: (!) 152/82 112/77 129/67 (!) 157/90  Pulse: 64 (!) 56 (!) 55 69  Resp: 20 20 (!) 21 20  Temp: 97.6 F (36.4 C)     TempSrc: Oral     SpO2: 100% 97% 100% 100%  Weight:      Height:       Eyes: PERRL, lids and conjunctivae normal ENMT: Mucous membranes are moist. Posterior pharynx clear of any exudate or lesions.Normal dentition.  Neck: normal, supple, no masses,  no thyromegaly Respiratory: clear to auscultation bilaterally, no wheezing, no crackles. Normal respiratory effort. No accessory muscle use.  Cardiovascular: Regular rate and rhythm, no murmurs / rubs / gallops. No extremity edema. 2+ pedal pulses. No carotid bruits.  Abdomen: no tenderness, no masses palpated. No hepatosplenomegaly. Bowel sounds positive.  Musculoskeletal: no clubbing / cyanosis. No joint deformity upper and lower extremities. Good ROM, no contractures. Normal muscle tone.  Skin: no rashes, lesions, ulcers. No induration Neurologic: CN 2-12 grossly intact. Sensation intact, DTR normal. Strength 5/5 in all 4.  Psychiatric: Normal judgment and insight. Alert and oriented x 3. Normal mood.     Labs on Admission: I have personally reviewed following labs and imaging studies  CBC: Recent Labs  Lab 02/02/24 1019  WBC 9.7  HGB 16.6*  HCT 51.5*  MCV 94.0  PLT 269   Basic Metabolic Panel: Recent Labs  Lab 02/02/24 1019  NA 139  K 4.0  CL 97*  CO2 26  GLUCOSE 91  BUN 15  CREATININE 0.78  CALCIUM 9.9   GFR: Estimated Creatinine Clearance: 53.7 mL/min (by C-G formula based on SCr of 0.78 mg/dL). Liver Function Tests: Recent Labs  Lab 02/02/24 1019  AST 26  ALT 17  ALKPHOS 54  BILITOT 1.9*  PROT 8.6*  ALBUMIN 4.8   No results for input(s): LIPASE, AMYLASE in the last 168 hours. No results for input(s): AMMONIA in the last 168 hours. Coagulation Profile: No results for input(s): INR, PROTIME in the last 168 hours. Cardiac Enzymes: No results for input(s): CKTOTAL, CKMB, CKMBINDEX, TROPONINI in the last 168 hours. BNP (last 3 results) No results for input(s): PROBNP in the last 8760 hours. HbA1C: No results for input(s): HGBA1C in the last 72 hours. CBG: Recent Labs  Lab 02/02/24 0959  GLUCAP 196*   Lipid Profile: No results for input(s): CHOL, HDL, LDLCALC, TRIG, CHOLHDL, LDLDIRECT in the last 72 hours. Thyroid   Function Tests: No results for input(s): TSH, T4TOTAL, FREET4, T3FREE, THYROIDAB in the last 72 hours. Anemia Panel: No results for input(s): VITAMINB12, FOLATE, FERRITIN, TIBC, IRON, RETICCTPCT in the last 72 hours. Urine analysis: No results found for: COLORURINE, APPEARANCEUR, LABSPEC, PHURINE, GLUCOSEU, HGBUR, BILIRUBINUR, KETONESUR, PROTEINUR, UROBILINOGEN, NITRITE, LEUKOCYTESUR  Radiological Exams on Admission: CT Angio Chest/Abd/Pel for Dissection W and/or Wo Contrast Result Date: 02/02/2024 EXAM: CTA CHEST, ABDOMEN AND PELVIS WITHOUT AND WITH CONTRAST 02/02/2024 12:35:40 PM TECHNIQUE: CTA of the chest was performed without and with the administration of 100 mL of iohexol  (OMNIPAQUE ) 350 MG/ML injection. CTA of the abdomen and pelvis was performed without and with the administration of 100 mL of iohexol  (OMNIPAQUE ) 350 MG/ML injection. Multiplanar reformatted images are provided for review. MIP images are provided for review. Automated exposure control, iterative reconstruction, and/or weight based adjustment of the mA/kV was utilized to  reduce the radiation dose to as low as reasonably achievable. COMPARISON: CTA of the abdomen and pelvis dated 08/28/2009. CLINICAL HISTORY: Acute aortic syndrome (AAS) suspected. FINDINGS: VASCULATURE: AORTA: There is minimal calcific plaque within the ascending thoracic aorta. The abdominal aorta demonstrates minimal calcific atheromatous disease. There is no evidence of acute aortic syndrome. No abdominal aortic aneurysm. No dissection. PULMONARY ARTERIES: No pulmonary embolism with the limits of this exam. GREAT VESSELS OF AORTIC ARCH: No acute finding. No dissection. No arterial occlusion or significant stenosis. CELIAC TRUNK: No acute finding. No occlusion or significant stenosis. SUPERIOR MESENTERIC ARTERY: No acute finding. No occlusion or significant stenosis. INFERIOR MESENTERIC ARTERY: No acute finding. No  occlusion or significant stenosis. RENAL ARTERIES: No acute finding. No occlusion or significant stenosis. ILIAC ARTERIES: No acute finding. No occlusion or significant stenosis. CHEST: MEDIASTINUM: No mediastinal lymphadenopathy. The heart and pericardium demonstrate no acute abnormality. LUNGS AND PLEURA: There is moderate centrilobular and paraseptal emphysema. No focal consolidation or pulmonary edema. No evidence of pleural effusion or pneumothorax. THORACIC BONES AND SOFT TISSUES: No acute bone or soft tissue abnormality. ABDOMEN AND PELVIS: LIVER: The liver is unremarkable. GALLBLADDER AND BILE DUCTS: Gallbladder is unremarkable. No biliary ductal dilatation. SPLEEN: The spleen is unremarkable. PANCREAS: The pancreas is unremarkable. ADRENAL GLANDS: Bilateral adrenal glands demonstrate no acute abnormality. KIDNEYS, URETERS AND BLADDER: No stones in the kidneys or ureters. No hydronephrosis. No perinephric or periureteral stranding. Urinary bladder is unremarkable. GI AND BOWEL: Stomach and duodenal sweep demonstrate no acute abnormality. There is no bowel obstruction. No abnormal bowel wall thickening or distension. REPRODUCTIVE: The uterus is absent or atrophic. PERITONEUM AND RETROPERITONEUM: No ascites or free air. LYMPH NODES: No lymphadenopathy. ABDOMINAL BONES AND SOFT TISSUES: No acute abnormality of the bones. No acute soft tissue abnormality. IMPRESSION: 1. No evidence of acute aortic syndrome. 2. Moderate centrilobular and paraseptal emphysema. Electronically signed by: Evalene Coho MD 02/02/2024 12:44 PM EDT RP Workstation: [id number]   CT HEAD WO CONTRAST ( ) Result Date: 02/02/2024 EXAM: CT HEAD WITHOUT CONTRAST 02/02/2024 12:35:40 PM TECHNIQUE: CT of the head was performed without the administration of intravenous contrast. Automated exposure control, iterative reconstruction, and/or weight based adjustment of the mA/kV was utilized to reduce the radiation dose to as low as reasonably  achievable. COMPARISON: CT of the head dated 08/20/2016. CLINICAL HISTORY: Head trauma, abnormal mental status (Age 30-64y). FINDINGS: BRAIN AND VENTRICLES: No acute hemorrhage. No evidence of acute infarct. No hydrocephalus. No extra-axial collection. No mass effect or midline shift. ORBITS: No acute abnormality. SINUSES: No acute abnormality. SOFT TISSUES AND SKULL: No acute soft tissue abnormality. No skull fracture. IMPRESSION: 1. No acute intracranial abnormality. Electronically signed by: Evalene Coho MD 02/02/2024 12:38 PM EDT RP Workstation: [id number]   DG Chest 2 View Result Date: 02/02/2024 CLINICAL DATA:  Chest pain. EXAM: CHEST - 2 VIEW COMPARISON:  01/15/2024 FINDINGS: Left apical pneumothorax seen previously is no longer evident. Pleuroparenchymal scarring is seen in the apices, left greater than right. Lungs otherwise hyperexpanded but clear. Symmetric nodular densities over the lower lungs are compatible with nipple shadows. No acute bony abnormality. IMPRESSION: 1. No active cardiopulmonary disease. 2. Interval resolution of left apical pneumothorax. Electronically Signed   By: Camellia Candle M.D.   On: 02/02/2024 10:23    EKG: Independently reviewed.  Sinus rhythm, no acute ST changes.  Assessment/Plan Principal Problem:   NSTEMI (non-ST elevated myocardial infarction) (HCC)  (please populate well all problems here in Problem List. (For example, if patient is  on BP meds at home and you resume or decide to hold them, it is a problem that needs to be her. Same for CAD, COPD, HLD and so on)   NSTEMI - Continue ACS medication including aspirin, heparin drip, continue ACEI - Patient is borderline bradycardia, will hold off beta-blocker - Risk factor modification, smoking cessation education performed at bedside.  Check A1c and lipid panel - Echocardiogram - Cardiology consult - Check TSH and UDS  HTN - Continue lisinopril   COPD Recent left-sided pneumothorax -Chest x-ray  and CT chest showed no residual pneumothorax. - No symptoms or signs of acute exacerbation - Continue as needed albuterol  and Spiriva - Outpatient follow-up with pulmonology  Hypothyroidism - Continue Synthroid  DVT prophylaxis: Heparin drip Code Status: Full code Family Communication: Son at bedside Disposition Plan: Patient is sick with NSTEMI requiring inpatient ischemia workup and inpatient cardiology consultation, expect more than 2 midnight hospital stay Consults called: Cardiology Admission status: PCU admit   Cort ONEIDA Mana MD [facility] Pager [phone]  02/02/2024, 1:41 PM

## 2024-02-02 NOTE — Progress Notes (Signed)
  Echocardiogram 2D Echocardiogram has been performed.  Beth Moore 02/02/2024, 3:02 PM

## 2024-02-02 NOTE — Progress Notes (Signed)
 Patient son was notified during AM shift to not smoke in the bathroom. He was smoking. And that the bathroom is for the patients only. Patient is again using the bathroom and smoking. Patient and son again reminded that the bathroom is for patients only and no smoking inside of the hospital.

## 2024-02-03 DIAGNOSIS — I214 Non-ST elevation (NSTEMI) myocardial infarction: Secondary | ICD-10-CM | POA: Diagnosis not present

## 2024-02-03 LAB — ECHOCARDIOGRAM COMPLETE
AR max vel: 2.34 cm2
AV Peak grad: 9.1 mmHg
Ao pk vel: 1.51 m/s
Area-P 1/2: 2.14 cm2
Height: 63 in
S' Lateral: 2.1 cm
Weight: 1604.95 [oz_av]

## 2024-02-03 LAB — HEPARIN LEVEL (UNFRACTIONATED)
Heparin Unfractionated: 0.31 [IU]/mL (ref 0.30–0.70)
Heparin Unfractionated: 0.21 [IU]/mL — ABNORMAL LOW (ref 0.30–0.70)
Heparin Unfractionated: 0.33 [IU]/mL (ref 0.30–0.70)
Heparin Unfractionated: 0.29 [IU]/mL — ABNORMAL LOW (ref 0.30–0.70)

## 2024-02-03 LAB — LIPID PANEL
Cholesterol: 217 mg/dL — ABNORMAL HIGH (ref 0–200)
HDL: 60 mg/dL (ref >40)
LDL Cholesterol: 135 mg/dL — ABNORMAL HIGH (ref 0–99)
Total CHOL/HDL Ratio: 3.6 ratio
Triglycerides: 110 mg/dL (ref <150)
VLDL: 22 mg/dL (ref 0–40)

## 2024-02-03 LAB — CBC
HCT: 43.2 % (ref 36.0–46.0)
Hemoglobin: 14.3 g/dL (ref 12.0–15.0)
MCH: 30.4 pg (ref 26.0–34.0)
MCHC: 33.1 g/dL (ref 30.0–36.0)
MCV: 91.7 fL (ref 80.0–100.0)
Platelets: 252 K/uL (ref 150–400)
RBC: 4.71 MIL/uL (ref 3.87–5.11)
RDW: 12.2 % (ref 11.5–15.5)
WBC: 6.4 K/uL (ref 4.0–10.5)
nRBC: 0 % (ref 0.0–0.2)

## 2024-02-03 LAB — TROPONIN I (HIGH SENSITIVITY): Troponin I (High Sensitivity): 89 ng/L — ABNORMAL HIGH (ref <18)

## 2024-02-03 MED ORDER — HEPARIN BOLUS VIA INFUSION
650.0000 [IU] | Freq: Once | INTRAVENOUS | Status: AC
  Administered 2024-02-03: 650 [IU] via INTRAVENOUS
  Filled 2024-02-03: qty 650

## 2024-02-03 MED ORDER — HEPARIN BOLUS VIA INFUSION
700.0000 [IU] | Freq: Once | INTRAVENOUS | Status: AC
Start: 2024-02-04 — End: 2024-02-03
  Administered 2024-02-03: 700 [IU] via INTRAVENOUS
  Filled 2024-02-03: qty 700

## 2024-02-03 NOTE — Progress Notes (Signed)
 Patient ID: Beth Moore, female   DOB: 19-Dec-1965, 58 y.o.   MRN: [id number] [facility]    SUBJECTIVE: Resting comfortably no current chest pain reportedly had an episode last evening received sublingual nitroglycerin troponins are trending downward no real shortness of breath resting comfortably   Vitals:   02/03/24 0033 02/03/24 0419 02/03/24 0838 02/03/24 1238  BP: 96/60 109/60 119/77 105/64  Pulse: (!) 54 (!) 57 (!) 52 (!) 58  Resp: 19 18 16 20   Temp: 98.7 F (37.1 C) 98.1 F (36.7 C) 98.7 F (37.1 C) 98.5 F (36.9 C)  TempSrc:   Oral   SpO2: 97% 99% 99% 99%  Weight:      Height:         Intake/Output Summary (Last 24 hours) at 02/03/2024 1504 Last data filed at 02/03/2024 1425 Gross per 24 hour  Intake 1228.37 ml  Output --  Net 1228.37 ml      PHYSICAL EXAM  General: Well developed, well nourished, in no acute distress HEENT:  Normocephalic and atramatic Neck:  No JVD.  Lungs: Clear bilaterally to auscultation and percussion. Heart: HRRR . Normal S1 and S2 without gallops or murmurs.  Abdomen: Bowel sounds are positive, abdomen soft and non-tender  Msk:  Back normal, normal gait. Normal strength and tone for age. Extremities: No clubbing, cyanosis or edema.   Neuro: Alert and oriented X 3. Psych:  Good affect, responds appropriately   LABS: Basic Metabolic Panel: Recent Labs    02/02/24 1019  NA 139  K 4.0  CL 97*  CO2 26  GLUCOSE 91  BUN 15  CREATININE 0.78  CALCIUM 9.9   Liver Function Tests: Recent Labs    02/02/24 1019  AST 26  ALT 17  ALKPHOS 54  BILITOT 1.9*  PROT 8.6*  ALBUMIN 4.8   Recent Labs    02/02/24 1019  LIPASE 70*   CBC: Recent Labs    02/02/24 1019 02/03/24 0343  WBC 9.7 6.4  HGB 16.6* 14.3  HCT 51.5* 43.2  MCV 94.0 91.7  PLT 269 252   Cardiac Enzymes: No results for input(s): CKTOTAL, CKMB, CKMBINDEX, TROPONINI in the last 72 hours. BNP: Invalid input(s): POCBNP D-Dimer: No results for  input(s): DDIMER in the last 72 hours. Hemoglobin A1C: No results for input(s): HGBA1C in the last 72 hours. Fasting Lipid Panel: Recent Labs    02/03/24 1109  CHOL 217*  HDL 60  LDLCALC 135*  TRIG 110  CHOLHDL 3.6   Thyroid  Function Tests: Recent Labs    02/02/24 1528  TSH 5.851*   Anemia Panel: No results for input(s): VITAMINB12, FOLATE, FERRITIN, TIBC, IRON, RETICCTPCT in the last 72 hours.  ECHOCARDIOGRAM COMPLETE Result Date: 02/03/2024    ECHOCARDIOGRAM REPORT   Patient Name:   Beth Moore Date of Exam: 02/02/2024 Medical Rec #:  [id number]     Height:       63.0 in Accession #:    [id number]    Weight:       100.3 lb Date of Birth:  Sep 14, 1965     BSA:          1.442 m Patient Age:    58 years      BP:           157/90 mmHg Patient Gender: F             HR:           65 bpm. Exam Location:  [facility] Procedure: 2D Echo,  Cardiac Doppler, Color Doppler and Strain Analysis (Both            Spectral and Color Flow Doppler were utilized during procedure). Indications:     NSTEMI I21.4  History:         Patient has no prior history of Echocardiogram examinations.  Sonographer:     Thedora Louder RDCS, FASE Referring Phys:  [id number] CORT ONEIDA MANA Diagnosing Phys: Cara JONETTA Lovelace MD  Sonographer Comments: Suboptimal parasternal window. Image acquisition challenging due to respiratory motion. Global longitudinal strain was attempted. IMPRESSIONS  1. Left ventricular ejection fraction, by estimation, is 65 to 70%. The left ventricle has normal function. The left ventricle has no regional wall motion abnormalities. Left ventricular diastolic parameters were normal. The average left ventricular global longitudinal strain is 22.7 %. The global longitudinal strain is normal.  2. Right ventricular systolic function is normal. The right ventricular size is normal.  3. The mitral valve is normal in structure. No evidence of mitral valve regurgitation.  4. The aortic valve is normal in  structure. Aortic valve regurgitation is not visualized. FINDINGS  Left Ventricle: Left ventricular ejection fraction, by estimation, is 65 to 70%. The left ventricle has normal function. The left ventricle has no regional wall motion abnormalities. The average left ventricular global longitudinal strain is 22.7 %. Strain was performed and the global longitudinal strain is normal. The left ventricular internal cavity size was normal in size. There is borderline concentric left ventricular hypertrophy. Left ventricular diastolic parameters were normal. Right Ventricle: The right ventricular size is normal. No increase in right ventricular wall thickness. Right ventricular systolic function is normal. Left Atrium: Left atrial size was normal in size. Right Atrium: Right atrial size was normal in size. Pericardium: There is no evidence of pericardial effusion. Mitral Valve: The mitral valve is normal in structure. There is mild thickening of the mitral valve leaflet(s). Normal mobility of the mitral valve leaflets. No evidence of mitral valve regurgitation. Tricuspid Valve: The tricuspid valve is normal in structure. Tricuspid valve regurgitation is trivial. Aortic Valve: The aortic valve is normal in structure. Aortic valve regurgitation is not visualized. Aortic valve peak gradient measures 9.1 mmHg. Pulmonic Valve: The pulmonic valve was normal in structure. Pulmonic valve regurgitation is not visualized. Aorta: The ascending aorta was not well visualized. IAS/Shunts: No atrial level shunt detected by color flow Doppler. Additional Comments: 3D was performed not requiring image post processing on an independent workstation and was normal.  LEFT VENTRICLE PLAX 2D LVIDd:         3.20 cm   Diastology LVIDs:         2.10 cm   LV e' medial:    8.70 cm/s LV PW:         1.10 cm   LV E/e' medial:  9.7 LV IVS:        1.20 cm   LV e' lateral:   10.20 cm/s LVOT diam:     1.80 cm   LV E/e' lateral: 8.3 LV SV:         77 LV SV  Index:   54        2D Longitudinal Strain LVOT Area:     2.54 cm  2D Strain GLS (A4C):   25.2 %                          2D Strain GLS (A3C):   16.9 %  2D Strain GLS (A2C):   26.0 %                          2D Strain GLS Avg:     22.7 % RIGHT VENTRICLE RV Basal diam:  2.00 cm RV S prime:     16.30 cm/s TAPSE (M-mode): 2.5 cm LEFT ATRIUM           Index        RIGHT ATRIUM           Index LA diam:      2.40 cm 1.66 cm/m   RA Area:     15.20 cm LA Vol (A2C): 31.3 ml 21.70 ml/m  RA Volume:   42.10 ml  29.19 ml/m LA Vol (A4C): 19.8 ml 13.73 ml/m  AORTIC VALVE                 PULMONIC VALVE AV Area (Vmax): 2.34 cm     PV Vmax:        1.06 m/s AV Vmax:        151.00 cm/s  PV Peak grad:   4.5 mmHg AV Peak Grad:   9.1 mmHg     RVOT Peak grad: 3 mmHg LVOT Vmax:      139.00 cm/s LVOT Vmean:     91.200 cm/s LVOT VTI:       0.304 m  AORTA Ao Root diam: 2.70 cm MITRAL VALVE               TRICUSPID VALVE MV Area (PHT): 2.14 cm    TR Peak grad:   28.5 mmHg MV Decel Time: 354 msec    TR Vmax:        267.00 cm/s MV E velocity: 84.40 cm/s MV A velocity: 82.30 cm/s  SHUNTS MV E/A ratio:  1.03        Systemic VTI:  0.30 m                            Systemic Diam: 1.80 cm Betul Brisky D Jaaron Oleson MD Electronically signed by Cara JONETTA Lovelace MD Signature Date/Time: 02/03/2024/11:08:33 AM    Final    CT Angio Chest/Abd/Pel for Dissection W and/or Wo Contrast Result Date: 02/02/2024 EXAM: CTA CHEST, ABDOMEN AND PELVIS WITHOUT AND WITH CONTRAST 02/02/2024 12:35:40 PM TECHNIQUE: CTA of the chest was performed without and with the administration of 100 mL of iohexol  (OMNIPAQUE ) 350 MG/ML injection. CTA of the abdomen and pelvis was performed without and with the administration of 100 mL of iohexol  (OMNIPAQUE ) 350 MG/ML injection. Multiplanar reformatted images are provided for review. MIP images are provided for review. Automated exposure control, iterative reconstruction, and/or weight based adjustment of the  mA/kV was utilized to reduce the radiation dose to as low as reasonably achievable. COMPARISON: CTA of the abdomen and pelvis dated 08/28/2009. CLINICAL HISTORY: Acute aortic syndrome (AAS) suspected. FINDINGS: VASCULATURE: AORTA: There is minimal calcific plaque within the ascending thoracic aorta. The abdominal aorta demonstrates minimal calcific atheromatous disease. There is no evidence of acute aortic syndrome. No abdominal aortic aneurysm. No dissection. PULMONARY ARTERIES: No pulmonary embolism with the limits of this exam. GREAT VESSELS OF AORTIC ARCH: No acute finding. No dissection. No arterial occlusion or significant stenosis. CELIAC TRUNK: No acute finding. No occlusion or significant stenosis. SUPERIOR MESENTERIC ARTERY: No acute finding. No occlusion or significant stenosis. INFERIOR MESENTERIC ARTERY: No acute finding. No occlusion  or significant stenosis. RENAL ARTERIES: No acute finding. No occlusion or significant stenosis. ILIAC ARTERIES: No acute finding. No occlusion or significant stenosis. CHEST: MEDIASTINUM: No mediastinal lymphadenopathy. The heart and pericardium demonstrate no acute abnormality. LUNGS AND PLEURA: There is moderate centrilobular and paraseptal emphysema. No focal consolidation or pulmonary edema. No evidence of pleural effusion or pneumothorax. THORACIC BONES AND SOFT TISSUES: No acute bone or soft tissue abnormality. ABDOMEN AND PELVIS: LIVER: The liver is unremarkable. GALLBLADDER AND BILE DUCTS: Gallbladder is unremarkable. No biliary ductal dilatation. SPLEEN: The spleen is unremarkable. PANCREAS: The pancreas is unremarkable. ADRENAL GLANDS: Bilateral adrenal glands demonstrate no acute abnormality. KIDNEYS, URETERS AND BLADDER: No stones in the kidneys or ureters. No hydronephrosis. No perinephric or periureteral stranding. Urinary bladder is unremarkable. GI AND BOWEL: Stomach and duodenal sweep demonstrate no acute abnormality. There is no bowel obstruction. No  abnormal bowel wall thickening or distension. REPRODUCTIVE: The uterus is absent or atrophic. PERITONEUM AND RETROPERITONEUM: No ascites or free air. LYMPH NODES: No lymphadenopathy. ABDOMINAL BONES AND SOFT TISSUES: No acute abnormality of the bones. No acute soft tissue abnormality. IMPRESSION: 1. No evidence of acute aortic syndrome. 2. Moderate centrilobular and paraseptal emphysema. Electronically signed by: Evalene Coho MD 02/02/2024 12:44 PM EDT RP Workstation: [id number]   CT HEAD WO CONTRAST ( ) Result Date: 02/02/2024 EXAM: CT HEAD WITHOUT CONTRAST 02/02/2024 12:35:40 PM TECHNIQUE: CT of the head was performed without the administration of intravenous contrast. Automated exposure control, iterative reconstruction, and/or weight based adjustment of the mA/kV was utilized to reduce the radiation dose to as low as reasonably achievable. COMPARISON: CT of the head dated 08/20/2016. CLINICAL HISTORY: Head trauma, abnormal mental status (Age 59-64y). FINDINGS: BRAIN AND VENTRICLES: No acute hemorrhage. No evidence of acute infarct. No hydrocephalus. No extra-axial collection. No mass effect or midline shift. ORBITS: No acute abnormality. SINUSES: No acute abnormality. SOFT TISSUES AND SKULL: No acute soft tissue abnormality. No skull fracture. IMPRESSION: 1. No acute intracranial abnormality. Electronically signed by: Evalene Coho MD 02/02/2024 12:38 PM EDT RP Workstation: [id number]   DG Chest 2 View Result Date: 02/02/2024 CLINICAL DATA:  Chest pain. EXAM: CHEST - 2 VIEW COMPARISON:  01/15/2024 FINDINGS: Left apical pneumothorax seen previously is no longer evident. Pleuroparenchymal scarring is seen in the apices, left greater than right. Lungs otherwise hyperexpanded but clear. Symmetric nodular densities over the lower lungs are compatible with nipple shadows. No acute bony abnormality. IMPRESSION: 1. No active cardiopulmonary disease. 2. Interval resolution of left apical pneumothorax.  Electronically Signed   By: Camellia Candle M.D.   On: 02/02/2024 10:23     Echo Left ventricular function EF 5 at least 55%  TELEMETRY: Sinus bradycardia nonspecific ST-T wave changes:  ASSESSMENT AND PLAN:  Principal Problem:   NSTEMI (non-ST elevated myocardial infarction) (HCC) Chest pain Smoking Elevated troponins consistent with demand ischemia rather than non-STEMI COPD Hypertension  Plan Agreed admit to telemetry follow-up troponins EKG anticoagulation for 48 to 72 hours Echocardiogram suggest preserved left ventricular function continue current management Chest pain possible angina nitrates consider amlodipine ACE or ARB avoid beta-blockers because of mild bradycardia Inhalers essentially for COPD type symptoms Smoking advised patient refrain from tobacco abuse Recommend ischemia workup with a functional study prior to discharge exercise Myoview   Cara JONETTA Lovelace, MD, 02/03/2024 3:04 PM

## 2024-02-03 NOTE — Consult Note (Signed)
 PHARMACY - ANTICOAGULATION CONSULT NOTE  Pharmacy Consult for heparin infusion Indication: chest pain/ACS  No Known Allergies  Patient Measurements: Height: 5' 3 (160 cm) Weight: 45.5 kg (100 lb 5 oz) IBW/kg (Calculated) : 52.4 HEPARIN DW (KG): 45.5  Vital Signs: Temp: 97.8 F (36.6 C) (10/05 2321) BP: 130/77 (10/05 2321) Pulse Rate: 54 (10/05 2321)  Labs: Recent Labs    02/02/24 1019 02/02/24 1152 02/02/24 1528 02/02/24 2050 02/03/24 0343 02/03/24 0959 02/03/24 1109 02/03/24 1718 02/03/24 2216  HGB 16.6*  --   --   --  14.3  --   --   --   --   HCT 51.5*  --   --   --  43.2  --   --   --   --   PLT 269  --   --   --  252  --   --   --   --   APTT 28  --   --   --   --   --   --   --   --   LABPROT 12.8  --   --   --   --   --   --   --   --   INR 0.9  --   --   --   --   --   --   --   --   HEPARINUNFRC  --   --   --    < > 0.31 0.21*  --  0.33 0.29*  CREATININE 0.78  --   --   --   --   --   --   --   --   TROPONINIHS 54* 146* 236*  --   --   --  89*  --   --    < > = values in this interval not displayed.    Estimated Creatinine Clearance: 53.7 mL/min (by C-G formula based on SCr of 0.78 mg/dL).   Medical History: Past Medical History:  Diagnosis Date   GERD (gastroesophageal reflux disease)    Hyperlipemia    Hypertension    Thyroid  disease     Medications:  No home anticoagulants per pharmacist review  Assessment: 58 yo female with PMH including HTN, COPD, cigarette smoking, presented to ED with complaint of new onset chest pain.  Patient found to have elevated troponins trending up 54 > 146 > 236. Baseline EKG on 10/4 showed sinus rhythm and no acute ST changes. Pharmacy consulted to initiation and management of heparin infusion.  10/4 Baseline aPTT 28, INR 0.9, PLT 252  Date Time HL Rate/Comment 10/04 2050 0.14 SUBtherapeutic @ 550 units/hr 10/05 0343 0.31 Therapeutic x 1 @ 650 units/hr 10/05   0959   0.21     SUBtherapeutic @ 650  units/hr 10/05 1718 0.33 Therapeutic x1 @ 750 units/hour  Goal of Therapy:  Heparin level 0.3-0.7 units/ml Monitor platelets by anticoagulation protocol: Yes   Plan:  -Bolus 700 units x 1 -Increase heparin infusion rate to 850 units/hour -Recheck heparin level in 6 hours to confirm -Monitor CBC and signs/symptoms of bleeding daily while on heparin  Thank you for involving pharmacy in this patient's care.   Rankin CANDIE Dills, PharmD, [facility] 02/03/2024 11:27 PM

## 2024-02-03 NOTE — Consult Note (Signed)
 PHARMACY - ANTICOAGULATION CONSULT NOTE  Pharmacy Consult for heparin infusion Indication: chest pain/ACS  No Known Allergies  Patient Measurements: Height: 5' 3 (160 cm) Weight: 45.5 kg (100 lb 5 oz) IBW/kg (Calculated) : 52.4 HEPARIN DW (KG): 45.5  Vital Signs: Temp: 98.1 F (36.7 C) (10/05 0419) BP: 109/60 (10/05 0419) Pulse Rate: 57 (10/05 0419)  Labs: Recent Labs    02/02/24 1019 02/02/24 1152 02/02/24 1528 02/02/24 2050 02/03/24 0343  HGB 16.6*  --   --   --  14.3  HCT 51.5*  --   --   --  43.2  PLT 269  --   --   --  252  APTT 28  --   --   --   --   LABPROT 12.8  --   --   --   --   INR 0.9  --   --   --   --   HEPARINUNFRC  --   --   --  0.14* 0.31  CREATININE 0.78  --   --   --   --   TROPONINIHS 54* 146* 236*  --   --     Estimated Creatinine Clearance: 53.7 mL/min (by C-G formula based on SCr of 0.78 mg/dL).   Medical History: Past Medical History:  Diagnosis Date   GERD (gastroesophageal reflux disease)    Hyperlipemia    Hypertension    Thyroid  disease     Medications:  No home anticoagulants per pharmacist review  Assessment: 58 yo female presented to ED with complaint of chest pain.  Patient found to have elevated troponin.  Pharmacy consulted to initiate heparin infusion.  Baseline aPTT and PT/INR ordered.  Date Time HL Rate/Comment 10/04 2050 0.14 SUBtherapeutic 10/05 0343 0.31 Therapeutic x 1  Goal of Therapy:  Heparin level 0.3-0.7 units/ml Monitor platelets by anticoagulation protocol: Yes   Plan:  Continue heparin infusion rate at 650 units/hour Recheck heparin level in 6 hours to confirm Monitor CBC and signs/symptoms of bleeding  Thank you for involving pharmacy in this patient's care.   Rankin CANDIE Dills, PharmD, [facility] 02/03/2024 6:14 AM

## 2024-02-03 NOTE — Consult Note (Addendum)
 PHARMACY - ANTICOAGULATION CONSULT NOTE  Pharmacy Consult for heparin infusion Indication: chest pain/ACS  No Known Allergies  Patient Measurements: Height: 5' 3 (160 cm) Weight: 45.5 kg (100 lb 5 oz) IBW/kg (Calculated) : 52.4 HEPARIN DW (KG): 45.5  Vital Signs: Temp: 98.7 F (37.1 C) (10/05 0838) BP: 119/77 (10/05 0838) Pulse Rate: 52 (10/05 0838)  Labs: Recent Labs    02/02/24 1019 02/02/24 1152 02/02/24 1528 02/02/24 2050 02/03/24 0343  HGB 16.6*  --   --   --  14.3  HCT 51.5*  --   --   --  43.2  PLT 269  --   --   --  252  APTT 28  --   --   --   --   LABPROT 12.8  --   --   --   --   INR 0.9  --   --   --   --   HEPARINUNFRC  --   --   --  0.14* 0.31  CREATININE 0.78  --   --   --   --   TROPONINIHS 54* 146* 236*  --   --     Estimated Creatinine Clearance: 53.7 mL/min (by C-G formula based on SCr of 0.78 mg/dL).   Medical History: Past Medical History:  Diagnosis Date   GERD (gastroesophageal reflux disease)    Hyperlipemia    Hypertension    Thyroid  disease     Medications:  No home anticoagulants per pharmacist review  Assessment: 58 yo female with PMH including HTN, COPD, cigarette smoking, presented to ED with complaint of new onset chest pain.  Patient found to have elevated troponins trending up 54 > 146 > 236. Baseline EKG on 10/4 showed sinus rhythm and no acute ST changes. Pharmacy consulted to initiation and management of heparin infusion.  10/4 Baseline aPTT 28, INR 0.9, PLT 252  Date Time HL Rate/Comment 10/04 2050 0.14 SUBtherapeutic @ 550 units/hr 10/05 0343 0.31 Therapeutic x 1 @ 650 units/hr 10/05   0959   0.21     SUBtherapeutic @ 650 units/hr  Goal of Therapy:  Heparin level 0.3-0.7 units/ml Monitor platelets by anticoagulation protocol: Yes   Plan:  -Give heparin bolus of 650 units x1 -Increase heparin infusion rate to 750 units/hour -Check heparin level in 6 hours -Monitor CBC and signs/symptoms of bleeding daily  while on heparin  Thank you for involving pharmacy in this patient's care.   Mardy Lime, PharmD Candidate 02/03/2024 9:19 AM

## 2024-02-03 NOTE — Progress Notes (Signed)
 [facility]  - [place] at [facility]   PATIENT NAME: Beth Moore    MR#:  [id number]  DATE OF BIRTH:  11-08-1965  SUBJECTIVE:  family at bedside. Patient denies any chest pain. I did get nitroglycerin at night per RN. No shortness of breath. Discussed importance of smoking cessation.    VITALS:  Blood pressure 119/77, pulse (!) 52, temperature 98.7 F (37.1 C), temperature source Oral, resp. rate 16, height 5' 3 (1.6 m), weight 45.5 kg, SpO2 99%.  PHYSICAL EXAMINATION:   GENERAL:  58 y.o.-year-old patient with no acute distress. Thin LUNGS: distant breath sounds bilaterally, no wheezing CARDIOVASCULAR: S1, S2 normal. No murmur   ABDOMEN: Soft, nontender, nondistended. Bowel sounds present.  EXTREMITIES: No  edema b/l.    NEUROLOGIC: nonfocal  patient is alert and awake   LABORATORY PANEL:  CBC Recent Labs  Lab 02/03/24 0343  WBC 6.4  HGB 14.3  HCT 43.2  PLT 252    Chemistries  Recent Labs  Lab 02/02/24 1019  NA 139  K 4.0  CL 97*  CO2 26  GLUCOSE 91  BUN 15  CREATININE 0.78  CALCIUM 9.9  AST 26  ALT 17  ALKPHOS 54  BILITOT 1.9*   Cardiac Enzymes No results for input(s): TROPONINI in the last 168 hours. RADIOLOGY:  ECHOCARDIOGRAM COMPLETE Result Date: 02/03/2024    ECHOCARDIOGRAM REPORT   Patient Name:   Beth Moore Date of Exam: 02/02/2024 Medical Rec #:  [id number]     Height:       63.0 in Accession #:    [id number]    Weight:       100.3 lb Date of Birth:  1966-02-06     BSA:          1.442 m Patient Age:    58 years      BP:           157/90 mmHg Patient Gender: F             HR:           65 bpm. Exam Location:  [facility] Procedure: 2D Echo, Cardiac Doppler, Color Doppler and Strain Analysis (Both            Spectral and Color Flow Doppler were utilized during procedure). Indications:     NSTEMI I21.4  History:         Patient has no prior history of Echocardiogram examinations.  Sonographer:     Thedora Louder RDCS, FASE Referring  Phys:  [id number] CORT ONEIDA MANA Diagnosing Phys: Cara JONETTA Lovelace MD  Sonographer Comments: Suboptimal parasternal window. Image acquisition challenging due to respiratory motion. Global longitudinal strain was attempted. IMPRESSIONS  1. Left ventricular ejection fraction, by estimation, is 65 to 70%. The left ventricle has normal function. The left ventricle has no regional wall motion abnormalities. Left ventricular diastolic parameters were normal. The average left ventricular global longitudinal strain is 22.7 %. The global longitudinal strain is normal.  2. Right ventricular systolic function is normal. The right ventricular size is normal.  3. The mitral valve is normal in structure. No evidence of mitral valve regurgitation.  4. The aortic valve is normal in structure. Aortic valve regurgitation is not visualized. FINDINGS  Left Ventricle: Left ventricular ejection fraction, by estimation, is 65 to 70%. The left ventricle has normal function. The left ventricle has no regional wall motion abnormalities. The average left ventricular global longitudinal strain is 22.7 %. Strain was performed and the  global longitudinal strain is normal. The left ventricular internal cavity size was normal in size. There is borderline concentric left ventricular hypertrophy. Left ventricular diastolic parameters were normal. Right Ventricle: The right ventricular size is normal. No increase in right ventricular wall thickness. Right ventricular systolic function is normal. Left Atrium: Left atrial size was normal in size. Right Atrium: Right atrial size was normal in size. Pericardium: There is no evidence of pericardial effusion. Mitral Valve: The mitral valve is normal in structure. There is mild thickening of the mitral valve leaflet(s). Normal mobility of the mitral valve leaflets. No evidence of mitral valve regurgitation. Tricuspid Valve: The tricuspid valve is normal in structure. Tricuspid valve regurgitation is trivial.  Aortic Valve: The aortic valve is normal in structure. Aortic valve regurgitation is not visualized. Aortic valve peak gradient measures 9.1 mmHg. Pulmonic Valve: The pulmonic valve was normal in structure. Pulmonic valve regurgitation is not visualized. Aorta: The ascending aorta was not well visualized. IAS/Shunts: No atrial level shunt detected by color flow Doppler. Additional Comments: 3D was performed not requiring image post processing on an independent workstation and was normal.  LEFT VENTRICLE PLAX 2D LVIDd:         3.20 cm   Diastology LVIDs:         2.10 cm   LV e' medial:    8.70 cm/s LV PW:         1.10 cm   LV E/e' medial:  9.7 LV IVS:        1.20 cm   LV e' lateral:   10.20 cm/s LVOT diam:     1.80 cm   LV E/e' lateral: 8.3 LV SV:         77 LV SV Index:   54        2D Longitudinal Strain LVOT Area:     2.54 cm  2D Strain GLS (A4C):   25.2 %                          2D Strain GLS (A3C):   16.9 %                          2D Strain GLS (A2C):   26.0 %                          2D Strain GLS Avg:     22.7 % RIGHT VENTRICLE RV Basal diam:  2.00 cm RV S prime:     16.30 cm/s TAPSE (M-mode): 2.5 cm LEFT ATRIUM           Index        RIGHT ATRIUM           Index LA diam:      2.40 cm 1.66 cm/m   RA Area:     15.20 cm LA Vol (A2C): 31.3 ml 21.70 ml/m  RA Volume:   42.10 ml  29.19 ml/m LA Vol (A4C): 19.8 ml 13.73 ml/m  AORTIC VALVE                 PULMONIC VALVE AV Area (Vmax): 2.34 cm     PV Vmax:        1.06 m/s AV Vmax:        151.00 cm/s  PV Peak grad:   4.5 mmHg AV Peak Grad:   9.1 mmHg  RVOT Peak grad: 3 mmHg LVOT Vmax:      139.00 cm/s LVOT Vmean:     91.200 cm/s LVOT VTI:       0.304 m  AORTA Ao Root diam: 2.70 cm MITRAL VALVE               TRICUSPID VALVE MV Area (PHT): 2.14 cm    TR Peak grad:   28.5 mmHg MV Decel Time: 354 msec    TR Vmax:        267.00 cm/s MV E velocity: 84.40 cm/s MV A velocity: 82.30 cm/s  SHUNTS MV E/A ratio:  1.03        Systemic VTI:  0.30 m                             Systemic Diam: 1.80 cm Cara JONETTA Lovelace MD Electronically signed by Cara JONETTA Lovelace MD Signature Date/Time: 02/03/2024/11:08:33 AM    Final    CT Angio Chest/Abd/Pel for Dissection W and/or Wo Contrast Result Date: 02/02/2024 EXAM: CTA CHEST, ABDOMEN AND PELVIS WITHOUT AND WITH CONTRAST 02/02/2024 12:35:40 PM TECHNIQUE: CTA of the chest was performed without and with the administration of 100 mL of iohexol  (OMNIPAQUE ) 350 MG/ML injection. CTA of the abdomen and pelvis was performed without and with the administration of 100 mL of iohexol  (OMNIPAQUE ) 350 MG/ML injection. Multiplanar reformatted images are provided for review. MIP images are provided for review. Automated exposure control, iterative reconstruction, and/or weight based adjustment of the mA/kV was utilized to reduce the radiation dose to as low as reasonably achievable. COMPARISON: CTA of the abdomen and pelvis dated 08/28/2009. CLINICAL HISTORY: Acute aortic syndrome (AAS) suspected. FINDINGS: VASCULATURE: AORTA: There is minimal calcific plaque within the ascending thoracic aorta. The abdominal aorta demonstrates minimal calcific atheromatous disease. There is no evidence of acute aortic syndrome. No abdominal aortic aneurysm. No dissection. PULMONARY ARTERIES: No pulmonary embolism with the limits of this exam. GREAT VESSELS OF AORTIC ARCH: No acute finding. No dissection. No arterial occlusion or significant stenosis. CELIAC TRUNK: No acute finding. No occlusion or significant stenosis. SUPERIOR MESENTERIC ARTERY: No acute finding. No occlusion or significant stenosis. INFERIOR MESENTERIC ARTERY: No acute finding. No occlusion or significant stenosis. RENAL ARTERIES: No acute finding. No occlusion or significant stenosis. ILIAC ARTERIES: No acute finding. No occlusion or significant stenosis. CHEST: MEDIASTINUM: No mediastinal lymphadenopathy. The heart and pericardium demonstrate no acute abnormality. LUNGS AND PLEURA: There is moderate  centrilobular and paraseptal emphysema. No focal consolidation or pulmonary edema. No evidence of pleural effusion or pneumothorax. THORACIC BONES AND SOFT TISSUES: No acute bone or soft tissue abnormality. ABDOMEN AND PELVIS: LIVER: The liver is unremarkable. GALLBLADDER AND BILE DUCTS: Gallbladder is unremarkable. No biliary ductal dilatation. SPLEEN: The spleen is unremarkable. PANCREAS: The pancreas is unremarkable. ADRENAL GLANDS: Bilateral adrenal glands demonstrate no acute abnormality. KIDNEYS, URETERS AND BLADDER: No stones in the kidneys or ureters. No hydronephrosis. No perinephric or periureteral stranding. Urinary bladder is unremarkable. GI AND BOWEL: Stomach and duodenal sweep demonstrate no acute abnormality. There is no bowel obstruction. No abnormal bowel wall thickening or distension. REPRODUCTIVE: The uterus is absent or atrophic. PERITONEUM AND RETROPERITONEUM: No ascites or free air. LYMPH NODES: No lymphadenopathy. ABDOMINAL BONES AND SOFT TISSUES: No acute abnormality of the bones. No acute soft tissue abnormality. IMPRESSION: 1. No evidence of acute aortic syndrome. 2. Moderate centrilobular and paraseptal emphysema. Electronically signed by: Evalene Coho MD 02/02/2024 12:44 PM  EDT RP Workstation: [id number]   CT HEAD WO CONTRAST ( ) Result Date: 02/02/2024 EXAM: CT HEAD WITHOUT CONTRAST 02/02/2024 12:35:40 PM TECHNIQUE: CT of the head was performed without the administration of intravenous contrast. Automated exposure control, iterative reconstruction, and/or weight based adjustment of the mA/kV was utilized to reduce the radiation dose to as low as reasonably achievable. COMPARISON: CT of the head dated 08/20/2016. CLINICAL HISTORY: Head trauma, abnormal mental status (Age 52-64y). FINDINGS: BRAIN AND VENTRICLES: No acute hemorrhage. No evidence of acute infarct. No hydrocephalus. No extra-axial collection. No mass effect or midline shift. ORBITS: No acute abnormality. SINUSES: No  acute abnormality. SOFT TISSUES AND SKULL: No acute soft tissue abnormality. No skull fracture. IMPRESSION: 1. No acute intracranial abnormality. Electronically signed by: Evalene Coho MD 02/02/2024 12:38 PM EDT RP Workstation: [id number]   DG Chest 2 View Result Date: 02/02/2024 CLINICAL DATA:  Chest pain. EXAM: CHEST - 2 VIEW COMPARISON:  01/15/2024 FINDINGS: Left apical pneumothorax seen previously is no longer evident. Pleuroparenchymal scarring is seen in the apices, left greater than right. Lungs otherwise hyperexpanded but clear. Symmetric nodular densities over the lower lungs are compatible with nipple shadows. No acute bony abnormality. IMPRESSION: 1. No active cardiopulmonary disease. 2. Interval resolution of left apical pneumothorax. Electronically Signed   By: Camellia Candle M.D.   On: 02/02/2024 10:23    Assessment and Plan From H and P   Codie Hainer is a 58 y.o. female with medical history significant of HTN, COPD, cigarette smoking, presented with new onset chest pain.   Symptoms started this morning, patient was at a supermarket, was sudden she started to have severe chest pain 8-9/10 pressure-like radiating to left side of neck and left 3 fingers tingling sensation, along with sweating and feeling nausea but no vomiting.  The chest pain was constant, and she did not notice significant exacerbation or relieving factors.  She decided to go back to her car and drove to the hospital herself.  Chest pain subsided in about 45-50 minutes after she arrived in the ED.   EKG showed sinus rhythm and no acute ST changes.  Troponin trending 54> 148 >236 Pt on Heparin gtt   NSTEMI - Continue ACS medication including aspirin, heparin drip, continue ACEI - Patient is borderline bradycardia, will hold off beta-blocker - Risk factor modification, smoking cessation education performed at bedside.   -- lipid panel--ordered today - Echocardiogram EF 65-70% - Cardiology consult with [facility]  cardiology--?stress test  HTN - Continue lisinopril    COPD with ongoing tobacco abuse Recent left-sided pneumothorax -Chest x-ray and CT chest showed no residual pneumothorax. - No symptoms or signs of acute exacerbation - Continue as needed albuterol  and Spiriva - Outpatient follow-up with pulmonology   Hypothyroidism - Continue Synthroid   DVT prophylaxis: Heparin drip Code Status: Full code Family Communication: Son at bedside Consults :[facility] cardiology Level of care: Progressive Status is: Inpatient Remains inpatient appropriate because: awaiting further plan per Cardiology    TOTAL TIME TAKING CARE OF THIS PATIENT: 35 minutes.  >50% time spent on counselling and coordination of care  Note: This dictation was prepared with Dragon dictation along with smaller phrase technology. Any transcriptional errors that result from this process are unintentional.  Leita Blanch M.D    [facility]   CC: Primary care physician; Center, [facility]

## 2024-02-03 NOTE — Consult Note (Signed)
 PHARMACY - ANTICOAGULATION CONSULT NOTE  Pharmacy Consult for heparin infusion Indication: chest pain/ACS  No Known Allergies  Patient Measurements: Height: 5' 3 (160 cm) Weight: 45.5 kg (100 lb 5 oz) IBW/kg (Calculated) : 52.4 HEPARIN DW (KG): 45.5  Vital Signs: Temp: 98.7 F (37.1 C) (10/05 1649) Temp Source: Oral (10/05 0838) BP: 122/74 (10/05 1649) Pulse Rate: 74 (10/05 1649)  Labs: Recent Labs    02/02/24 1019 02/02/24 1152 02/02/24 1528 02/02/24 2050 02/03/24 0343 02/03/24 0959 02/03/24 1109 02/03/24 1718  HGB 16.6*  --   --   --  14.3  --   --   --   HCT 51.5*  --   --   --  43.2  --   --   --   PLT 269  --   --   --  252  --   --   --   APTT 28  --   --   --   --   --   --   --   LABPROT 12.8  --   --   --   --   --   --   --   INR 0.9  --   --   --   --   --   --   --   HEPARINUNFRC  --   --   --    < > 0.31 0.21*  --  0.33  CREATININE 0.78  --   --   --   --   --   --   --   TROPONINIHS 54* 146* 236*  --   --   --  89*  --    < > = values in this interval not displayed.    Estimated Creatinine Clearance: 53.7 mL/min (by C-G formula based on SCr of 0.78 mg/dL).   Medical History: Past Medical History:  Diagnosis Date   GERD (gastroesophageal reflux disease)    Hyperlipemia    Hypertension    Thyroid  disease     Medications:  No home anticoagulants per pharmacist review  Assessment: 58 yo female with PMH including HTN, COPD, cigarette smoking, presented to ED with complaint of new onset chest pain.  Patient found to have elevated troponins trending up 54 > 146 > 236. Baseline EKG on 10/4 showed sinus rhythm and no acute ST changes. Pharmacy consulted to initiation and management of heparin infusion.  10/4 Baseline aPTT 28, INR 0.9, PLT 252  Date Time HL Rate/Comment 10/04 2050 0.14 SUBtherapeutic @ 550 units/hr 10/05 0343 0.31 Therapeutic x 1 @ 650 units/hr 10/05   0959   0.21     SUBtherapeutic @ 650 units/hr 10/05 1718 0.33 Therapeutic x1  @ 750 units/hour  Goal of Therapy:  Heparin level 0.3-0.7 units/ml Monitor platelets by anticoagulation protocol: Yes   Plan:  -Continue heparin infusion rate at 750 units/hour -Check heparin level in 6 hours to confirm -Monitor CBC and signs/symptoms of bleeding daily while on heparin  Thank you for involving pharmacy in this patient's care.   Damien Napoleon, PharmD Clinical Pharmacist 02/03/2024 6:25 PM

## 2024-02-03 NOTE — Plan of Care (Signed)

## 2024-02-04 ENCOUNTER — Inpatient Hospital Stay

## 2024-02-04 DIAGNOSIS — R079 Chest pain, unspecified: Secondary | ICD-10-CM

## 2024-02-04 LAB — NM MYOCAR MULTI W/SPECT W/WALL MOTION / EF
Base ST Depression (mm): 0 mm
Estimated workload: 1
Exercise duration (min): 1 min
Exercise duration (sec): 0 s
LV dias vol: 46 mL (ref 46–106)
LV sys vol: 6 mL (ref 3.8–5.2)
MPHR: 160 {beats}/min
Nuc Stress EF: 87 %
Peak HR: 94 {beats}/min
Percent HR: 58 %
Rest HR: 49 {beats}/min
Rest Nuclear Isotope Dose: 10.5 mCi
SDS: 0
SRS: 3
SSS: 7
ST Depression (mm): 0 mm
Stress Nuclear Isotope Dose: 33.1 mCi
TID: 1.03

## 2024-02-04 LAB — CBC
HCT: 42.8 % (ref 36.0–46.0)
Hemoglobin: 14.1 g/dL (ref 12.0–15.0)
MCH: 30.8 pg (ref 26.0–34.0)
MCHC: 32.9 g/dL (ref 30.0–36.0)
MCV: 93.4 fL (ref 80.0–100.0)
Platelets: 231 K/uL (ref 150–400)
RBC: 4.58 MIL/uL (ref 3.87–5.11)
RDW: 12.4 % (ref 11.5–15.5)
WBC: 6.6 K/uL (ref 4.0–10.5)
nRBC: 0 % (ref 0.0–0.2)

## 2024-02-04 LAB — HEPARIN LEVEL (UNFRACTIONATED): Heparin Unfractionated: 0.55 [IU]/mL (ref 0.30–0.70)

## 2024-02-04 MED ORDER — PANTOPRAZOLE SODIUM 40 MG PO TBEC
40.0000 mg | DELAYED_RELEASE_TABLET | Freq: Every day | ORAL | Status: DC
  Filled 2024-02-04: qty 1

## 2024-02-04 MED ORDER — PANTOPRAZOLE SODIUM 40 MG PO TBEC: 40.0000 mg | DELAYED_RELEASE_TABLET | Freq: Every day | ORAL | 0 refills | Status: AC

## 2024-02-04 MED ORDER — ISOSORBIDE MONONITRATE ER 30 MG PO TB24
15.0000 mg | ORAL_TABLET | Freq: Every day | ORAL | Status: DC
  Filled 2024-02-04: qty 1

## 2024-02-04 MED ORDER — LISINOPRIL 10 MG PO TABS: 10.0000 mg | ORAL_TABLET | Freq: Every day | ORAL | Status: DC

## 2024-02-04 MED ORDER — ISOSORBIDE MONONITRATE ER 30 MG PO TB24: 15.0000 mg | ORAL_TABLET | Freq: Every day | ORAL | 0 refills | Status: AC

## 2024-02-04 MED ORDER — LISINOPRIL 10 MG PO TABS: 10.0000 mg | ORAL_TABLET | Freq: Every day | ORAL | 0 refills | Status: AC

## 2024-02-04 MED ORDER — METOPROLOL TARTRATE 25 MG PO TABS: 25.0000 mg | ORAL_TABLET | Freq: Two times a day (BID) | ORAL | Status: DC

## 2024-02-04 MED ORDER — REGADENOSON 0.4 MG/5ML IV SOLN
0.4000 mg | Freq: Once | INTRAVENOUS | Status: AC
  Administered 2024-02-04: 0.4 mg via INTRAVENOUS

## 2024-02-04 MED ORDER — MORPHINE SULFATE (PF) 2 MG/ML IV SOLN: 2.0000 mg | INTRAVENOUS | Status: DC | PRN

## 2024-02-04 MED ORDER — ENOXAPARIN SODIUM 30 MG/0.3ML IJ SOSY: 30.0000 mg | PREFILLED_SYRINGE | INTRAMUSCULAR | Status: DC

## 2024-02-04 MED ORDER — TECHNETIUM TC 99M TETROFOSMIN IV KIT
33.0800 | PACK | Freq: Once | INTRAVENOUS | Status: AC | PRN
  Administered 2024-02-04: 33.08 via INTRAVENOUS

## 2024-02-04 MED ORDER — NICOTINE 14 MG/24HR TD PT24: 14.0000 mg | MEDICATED_PATCH | Freq: Every day | TRANSDERMAL | 0 refills | Status: AC

## 2024-02-04 MED ORDER — NITROGLYCERIN 0.4 MG SL SUBL
0.4000 mg | SUBLINGUAL_TABLET | SUBLINGUAL | 0 refills | Status: AC | PRN
Start: 2024-02-04 — End: 2025-02-03

## 2024-02-04 MED ORDER — TECHNETIUM TC 99M TETROFOSMIN IV KIT
10.0000 | PACK | Freq: Once | INTRAVENOUS | Status: AC | PRN
Start: 2024-02-04 — End: 2024-02-04
  Administered 2024-02-04: 10.45 via INTRAVENOUS

## 2024-02-04 MED ORDER — DOCUSATE SODIUM 100 MG PO CAPS
100.0000 mg | ORAL_CAPSULE | Freq: Two times a day (BID) | ORAL | Status: DC
  Administered 2024-02-04: 100 mg via ORAL
  Filled 2024-02-04: qty 1

## 2024-02-04 MED ORDER — ISOSORBIDE MONONITRATE ER 30 MG PO TB24: 30.0000 mg | ORAL_TABLET | Freq: Every day | ORAL | Status: DC

## 2024-02-04 NOTE — Discharge Instructions (Signed)
 You were seen in the hospital for chest pain.  Initially this was thought to be cardiac in nature.  You have now had a negative stress test which makes the suspicion for this being a true heart attack very low.  I suspect that your chest pain is more likely related to underlying reflux disease.  I advise you to stop smoking cigarettes, greatly reduced the amount of coffee you drink and try and reduce stress in your life.  You have been prescribed a blood pressure agent called isosorbide mononitrate and a antireflux agent called Protonix .  Please take these medications as prescribed.  Follow-up outpatient with cardiology.

## 2024-02-04 NOTE — Consult Note (Signed)
 PHARMACY - ANTICOAGULATION CONSULT NOTE  Pharmacy Consult for heparin infusion Indication: chest pain/ACS  No Known Allergies  Patient Measurements: Height: 5' 3 (160 cm) Weight: 45.5 kg (100 lb 5 oz) IBW/kg (Calculated) : 52.4 HEPARIN DW (KG): 45.5  Vital Signs: Temp: 97.8 F (36.6 C) (10/06 0413) BP: 112/69 (10/06 0413) Pulse Rate: 54 (10/05 2321)  Labs: Recent Labs    02/02/24 1019 02/02/24 1152 02/02/24 1528 02/02/24 2050 02/03/24 0343 02/03/24 0959 02/03/24 1109 02/03/24 1718 02/03/24 2216 02/04/24 0532  HGB 16.6*  --   --   --  14.3  --   --   --   --  14.1  HCT 51.5*  --   --   --  43.2  --   --   --   --  42.8  PLT 269  --   --   --  252  --   --   --   --  231  APTT 28  --   --   --   --   --   --   --   --   --   LABPROT 12.8  --   --   --   --   --   --   --   --   --   INR 0.9  --   --   --   --   --   --   --   --   --   HEPARINUNFRC  --   --   --    < > 0.31   < >  --  0.33 0.29* 0.55  CREATININE 0.78  --   --   --   --   --   --   --   --   --   TROPONINIHS 54* 146* 236*  --   --   --  89*  --   --   --    < > = values in this interval not displayed.    Estimated Creatinine Clearance: 53.7 mL/min (by C-G formula based on SCr of 0.78 mg/dL).   Medical History: Past Medical History:  Diagnosis Date   GERD (gastroesophageal reflux disease)    Hyperlipemia    Hypertension    Thyroid  disease     Medications:  No home anticoagulants per pharmacist review  Assessment: 58 yo female with PMH including HTN, COPD, cigarette smoking, presented to ED with complaint of new onset chest pain.  Patient found to have elevated troponins trending up 54 > 146 > 236. Baseline EKG on 10/4 showed sinus rhythm and no acute ST changes. Pharmacy consulted to initiation and management of heparin infusion.  10/4 Baseline aPTT 28, INR 0.9, PLT 252  Date Time HL Rate/Comment 10/04 2050 0.14 SUBtherapeutic @ 550 units/hr 10/05 0343 0.31 Therapeutic x 1 @ 650  units/hr 10/05   0959   0.21     SUBtherapeutic @ 650 units/hr 10/05 1718 0.33 Therapeutic x1 @ 750 units/hour 10/05 2216 0.29 Subtherapeutic 10/06 0532 0.55 Therapeutic x 1   Goal of Therapy:  Heparin level 0.3-0.7 units/ml Monitor platelets by anticoagulation protocol: Yes   Plan:  -Continue heparin infusion rate to 850 units/hour -Recheck heparin level in 6 hours to confirm -Monitor CBC and signs/symptoms of bleeding daily while on heparin  Thank you for involving pharmacy in this patient's care.   Rankin CANDIE Dills, PharmD, [facility] 02/04/2024 6:40 AM

## 2024-02-04 NOTE — Progress Notes (Signed)
 Patient has been provided discharge instructions to include follow up appointments and medications. Patient verbalizes understanding of instructions.

## 2024-02-04 NOTE — Plan of Care (Signed)
  Problem: Education: Goal: Understanding of cardiac disease, CV risk reduction, and recovery process will improve Outcome: Adequate for Discharge Goal: Individualized Educational Video(s) Outcome: Adequate for Discharge   Problem: Activity: Goal: Ability to tolerate increased activity will improve Outcome: Adequate for Discharge   Problem: Cardiac: Goal: Ability to achieve and maintain adequate cardiovascular perfusion will improve Outcome: Adequate for Discharge   Problem: Health Behavior/Discharge Planning: Goal: Ability to safely manage health-related needs after discharge will improve Outcome: Adequate for Discharge   Problem: Education: Goal: Knowledge of General Education information will improve Description: Including pain rating scale, medication(s)/side effects and non-pharmacologic comfort measures Outcome: Adequate for Discharge   Problem: Health Behavior/Discharge Planning: Goal: Ability to manage health-related needs will improve Outcome: Adequate for Discharge   Problem: Clinical Measurements: Goal: Ability to maintain clinical measurements within normal limits will improve Outcome: Adequate for Discharge Goal: Will remain free from infection Outcome: Adequate for Discharge Goal: Diagnostic test results will improve Outcome: Adequate for Discharge Goal: Respiratory complications will improve Outcome: Adequate for Discharge Goal: Cardiovascular complication will be avoided Outcome: Adequate for Discharge   Problem: Activity: Goal: Risk for activity intolerance will decrease Outcome: Adequate for Discharge   Problem: Nutrition: Goal: Adequate nutrition will be maintained Outcome: Adequate for Discharge   Problem: Coping: Goal: Level of anxiety will decrease Outcome: Adequate for Discharge   Problem: Elimination: Goal: Will not experience complications related to bowel motility Outcome: Adequate for Discharge Goal: Will not experience complications  related to urinary retention Outcome: Adequate for Discharge   Problem: Pain Managment: Goal: General experience of comfort will improve and/or be controlled Outcome: Adequate for Discharge   Problem: Safety: Goal: Ability to remain free from injury will improve Outcome: Adequate for Discharge   Problem: Skin Integrity: Goal: Risk for impaired skin integrity will decrease Outcome: Adequate for Discharge

## 2024-02-04 NOTE — Progress Notes (Cosign Needed Addendum)
 [facility] CLINIC CARDIOLOGY PROGRESS NOTE   Patient ID: Beth Moore MRN: [id number] DOB/AGE: 1966/03/16 58 y.o.  Admit date: 02/02/2024 Referring Physician Dr. [facility]  Primary Cardiologist None Reason for Consultation chest pain  HPI: Beth Moore is a 58 y.o. female with a past medical history of HTN, COPD, tobacco use who presented to the ED on 02/02/2024 for chest pain. Cardiology was consulted for further evaluation.   Interval History:  -patient seen and examined this AM, reports one episode of CP overnight.  -BP and HR stable this AM.  -NPO, plan for stress test today.  Review of systems complete and found to be negative unless listed above   Vitals:   02/03/24 1930 02/03/24 2321 02/04/24 0413 02/04/24 0727  BP: 113/60 130/77 112/69 111/89  Pulse: 61 (!) 54  (!) 51  Resp: 18 18 18 16   Temp: 98.4 F (36.9 C) 97.8 F (36.6 C) 97.8 F (36.6 C) 97.7 F (36.5 C)  TempSrc:      SpO2: 98% 100% 97% 100%  Weight:      Height:         Intake/Output Summary (Last 24 hours) at 02/04/2024 0945 Last data filed at 02/04/2024 0400 Gross per 24 hour  Intake 644.45 ml  Output --  Net 644.45 ml     PHYSICAL EXAM General: Well appearing female, well nourished, in no acute distress. HEENT: Normocephalic and atraumatic. Neck: No JVD.  Lungs: Normal respiratory effort on room air. Clear bilaterally to auscultation. No wheezes, crackles, rhonchi.  Heart: HRRR. Normal S1 and S2 without gallops or murmurs. Radial & DP pulses 2+ bilaterally. Abdomen: Non-distended appearing.  Msk: Normal strength and tone for age. Extremities: No clubbing, cyanosis or edema.   Neuro: Alert and oriented X 3. Psych: Mood appropriate, affect congruent.    LABS: Basic Metabolic Panel: Recent Labs    02/02/24 1019  NA 139  K 4.0  CL 97*  CO2 26  GLUCOSE 91  BUN 15  CREATININE 0.78  CALCIUM 9.9   Liver Function Tests: Recent Labs     02/02/24 1019  AST 26  ALT 17  ALKPHOS 54  BILITOT 1.9*  PROT 8.6*  ALBUMIN 4.8   Recent Labs    02/02/24 1019  LIPASE 70*   CBC: Recent Labs    02/03/24 0343 02/04/24 0532  WBC 6.4 6.6  HGB 14.3 14.1  HCT 43.2 42.8  MCV 91.7 93.4  PLT 252 231   Cardiac Enzymes: Recent Labs    02/02/24 1152 02/02/24 1528 02/03/24 1109  TROPONINIHS 146* 236* 89*   BNP: No results for input(s): BNP in the last 72 hours. D-Dimer: No results for input(s): DDIMER in the last 72 hours. Hemoglobin A1C: No results for input(s): HGBA1C in the last 72 hours. Fasting Lipid Panel: Recent Labs    02/03/24 1109  CHOL 217*  HDL 60  LDLCALC 135*  TRIG 110  CHOLHDL 3.6   Thyroid  Function Tests: Recent Labs    02/02/24 1528  TSH 5.851*   Anemia Panel: No results for input(s): VITAMINB12, FOLATE, FERRITIN, TIBC, IRON, RETICCTPCT in the last 72 hours.  ECHOCARDIOGRAM COMPLETE Result Date: 02/03/2024    ECHOCARDIOGRAM REPORT   Patient Name:   Beth Moore Date of Exam: 02/02/2024 Medical Rec #:  [id number]     Height:       63.0 in Accession #:    [id number]    Weight:       100.3  lb Date of Birth:  1965-07-18     BSA:          1.442 m Patient Age:    58 years      BP:           157/90 mmHg Patient Gender: F             HR:           65 bpm. Exam Location:  [facility] Procedure: 2D Echo, Cardiac Doppler, Color Doppler and Strain Analysis (Both            Spectral and Color Flow Doppler were utilized during procedure). Indications:     NSTEMI I21.4  History:         Patient has no prior history of Echocardiogram examinations.  Sonographer:     Thedora Louder RDCS, FASE Referring Phys:  [id number] CORT ONEIDA MANA Diagnosing Phys: Cara JONETTA Lovelace MD  Sonographer Comments: Suboptimal parasternal window. Image acquisition challenging due to respiratory motion. Global longitudinal strain was attempted. IMPRESSIONS  1. Left ventricular ejection fraction, by estimation, is 65 to 70%. The left  ventricle has normal function. The left ventricle has no regional wall motion abnormalities. Left ventricular diastolic parameters were normal. The average left ventricular global longitudinal strain is 22.7 %. The global longitudinal strain is normal.  2. Right ventricular systolic function is normal. The right ventricular size is normal.  3. The mitral valve is normal in structure. No evidence of mitral valve regurgitation.  4. The aortic valve is normal in structure. Aortic valve regurgitation is not visualized. FINDINGS  Left Ventricle: Left ventricular ejection fraction, by estimation, is 65 to 70%. The left ventricle has normal function. The left ventricle has no regional wall motion abnormalities. The average left ventricular global longitudinal strain is 22.7 %. Strain was performed and the global longitudinal strain is normal. The left ventricular internal cavity size was normal in size. There is borderline concentric left ventricular hypertrophy. Left ventricular diastolic parameters were normal. Right Ventricle: The right ventricular size is normal. No increase in right ventricular wall thickness. Right ventricular systolic function is normal. Left Atrium: Left atrial size was normal in size. Right Atrium: Right atrial size was normal in size. Pericardium: There is no evidence of pericardial effusion. Mitral Valve: The mitral valve is normal in structure. There is mild thickening of the mitral valve leaflet(s). Normal mobility of the mitral valve leaflets. No evidence of mitral valve regurgitation. Tricuspid Valve: The tricuspid valve is normal in structure. Tricuspid valve regurgitation is trivial. Aortic Valve: The aortic valve is normal in structure. Aortic valve regurgitation is not visualized. Aortic valve peak gradient measures 9.1 mmHg. Pulmonic Valve: The pulmonic valve was normal in structure. Pulmonic valve regurgitation is not visualized. Aorta: The ascending aorta was not well visualized.  IAS/Shunts: No atrial level shunt detected by color flow Doppler. Additional Comments: 3D was performed not requiring image post processing on an independent workstation and was normal.  LEFT VENTRICLE PLAX 2D LVIDd:         3.20 cm   Diastology LVIDs:         2.10 cm   LV e' medial:    8.70 cm/s LV PW:         1.10 cm   LV E/e' medial:  9.7 LV IVS:        1.20 cm   LV e' lateral:   10.20 cm/s LVOT diam:     1.80 cm   LV E/e' lateral:  8.3 LV SV:         77 LV SV Index:   54        2D Longitudinal Strain LVOT Area:     2.54 cm  2D Strain GLS (A4C):   25.2 %                          2D Strain GLS (A3C):   16.9 %                          2D Strain GLS (A2C):   26.0 %                          2D Strain GLS Avg:     22.7 % RIGHT VENTRICLE RV Basal diam:  2.00 cm RV S prime:     16.30 cm/s TAPSE (M-mode): 2.5 cm LEFT ATRIUM           Index        RIGHT ATRIUM           Index LA diam:      2.40 cm 1.66 cm/m   RA Area:     15.20 cm LA Vol (A2C): 31.3 ml 21.70 ml/m  RA Volume:   42.10 ml  29.19 ml/m LA Vol (A4C): 19.8 ml 13.73 ml/m  AORTIC VALVE                 PULMONIC VALVE AV Area (Vmax): 2.34 cm     PV Vmax:        1.06 m/s AV Vmax:        151.00 cm/s  PV Peak grad:   4.5 mmHg AV Peak Grad:   9.1 mmHg     RVOT Peak grad: 3 mmHg LVOT Vmax:      139.00 cm/s LVOT Vmean:     91.200 cm/s LVOT VTI:       0.304 m  AORTA Ao Root diam: 2.70 cm MITRAL VALVE               TRICUSPID VALVE MV Area (PHT): 2.14 cm    TR Peak grad:   28.5 mmHg MV Decel Time: 354 msec    TR Vmax:        267.00 cm/s MV E velocity: 84.40 cm/s MV A velocity: 82.30 cm/s  SHUNTS MV E/A ratio:  1.03        Systemic VTI:  0.30 m                            Systemic Diam: 1.80 cm Dwayne D Callwood MD Electronically signed by Cara JONETTA Lovelace MD Signature Date/Time: 02/03/2024/11:08:33 AM    Final    CT Angio Chest/Abd/Pel for Dissection W and/or Wo Contrast Result Date: 02/02/2024 EXAM: CTA CHEST, ABDOMEN AND PELVIS WITHOUT AND WITH CONTRAST 02/02/2024  12:35:40 PM TECHNIQUE: CTA of the chest was performed without and with the administration of 100 mL of iohexol  (OMNIPAQUE ) 350 MG/ML injection. CTA of the abdomen and pelvis was performed without and with the administration of 100 mL of iohexol  (OMNIPAQUE ) 350 MG/ML injection. Multiplanar reformatted images are provided for review. MIP images are provided for review. Automated exposure control, iterative reconstruction, and/or weight based adjustment of the mA/kV was utilized to reduce the radiation dose to as low as reasonably achievable. COMPARISON: CTA of the  abdomen and pelvis dated 08/28/2009. CLINICAL HISTORY: Acute aortic syndrome (AAS) suspected. FINDINGS: VASCULATURE: AORTA: There is minimal calcific plaque within the ascending thoracic aorta. The abdominal aorta demonstrates minimal calcific atheromatous disease. There is no evidence of acute aortic syndrome. No abdominal aortic aneurysm. No dissection. PULMONARY ARTERIES: No pulmonary embolism with the limits of this exam. GREAT VESSELS OF AORTIC ARCH: No acute finding. No dissection. No arterial occlusion or significant stenosis. CELIAC TRUNK: No acute finding. No occlusion or significant stenosis. SUPERIOR MESENTERIC ARTERY: No acute finding. No occlusion or significant stenosis. INFERIOR MESENTERIC ARTERY: No acute finding. No occlusion or significant stenosis. RENAL ARTERIES: No acute finding. No occlusion or significant stenosis. ILIAC ARTERIES: No acute finding. No occlusion or significant stenosis. CHEST: MEDIASTINUM: No mediastinal lymphadenopathy. The heart and pericardium demonstrate no acute abnormality. LUNGS AND PLEURA: There is moderate centrilobular and paraseptal emphysema. No focal consolidation or pulmonary edema. No evidence of pleural effusion or pneumothorax. THORACIC BONES AND SOFT TISSUES: No acute bone or soft tissue abnormality. ABDOMEN AND PELVIS: LIVER: The liver is unremarkable. GALLBLADDER AND BILE DUCTS: Gallbladder is  unremarkable. No biliary ductal dilatation. SPLEEN: The spleen is unremarkable. PANCREAS: The pancreas is unremarkable. ADRENAL GLANDS: Bilateral adrenal glands demonstrate no acute abnormality. KIDNEYS, URETERS AND BLADDER: No stones in the kidneys or ureters. No hydronephrosis. No perinephric or periureteral stranding. Urinary bladder is unremarkable. GI AND BOWEL: Stomach and duodenal sweep demonstrate no acute abnormality. There is no bowel obstruction. No abnormal bowel wall thickening or distension. REPRODUCTIVE: The uterus is absent or atrophic. PERITONEUM AND RETROPERITONEUM: No ascites or free air. LYMPH NODES: No lymphadenopathy. ABDOMINAL BONES AND SOFT TISSUES: No acute abnormality of the bones. No acute soft tissue abnormality. IMPRESSION: 1. No evidence of acute aortic syndrome. 2. Moderate centrilobular and paraseptal emphysema. Electronically signed by: Evalene Coho MD 02/02/2024 12:44 PM EDT RP Workstation: [id number]   CT HEAD WO CONTRAST ( ) Result Date: 02/02/2024 EXAM: CT HEAD WITHOUT CONTRAST 02/02/2024 12:35:40 PM TECHNIQUE: CT of the head was performed without the administration of intravenous contrast. Automated exposure control, iterative reconstruction, and/or weight based adjustment of the mA/kV was utilized to reduce the radiation dose to as low as reasonably achievable. COMPARISON: CT of the head dated 08/20/2016. CLINICAL HISTORY: Head trauma, abnormal mental status (Age 39-64y). FINDINGS: BRAIN AND VENTRICLES: No acute hemorrhage. No evidence of acute infarct. No hydrocephalus. No extra-axial collection. No mass effect or midline shift. ORBITS: No acute abnormality. SINUSES: No acute abnormality. SOFT TISSUES AND SKULL: No acute soft tissue abnormality. No skull fracture. IMPRESSION: 1. No acute intracranial abnormality. Electronically signed by: Evalene Coho MD 02/02/2024 12:38 PM EDT RP Workstation: [id number]   DG Chest 2 View Result Date: 02/02/2024 CLINICAL DATA:   Chest pain. EXAM: CHEST - 2 VIEW COMPARISON:  01/15/2024 FINDINGS: Left apical pneumothorax seen previously is no longer evident. Pleuroparenchymal scarring is seen in the apices, left greater than right. Lungs otherwise hyperexpanded but clear. Symmetric nodular densities over the lower lungs are compatible with nipple shadows. No acute bony abnormality. IMPRESSION: 1. No active cardiopulmonary disease. 2. Interval resolution of left apical pneumothorax. Electronically Signed   By: Camellia Candle M.D.   On: 02/02/2024 10:23     ECHO as above  TELEMETRY reviewed by me 02/04/24: sinus rhythm rate 50s  EKG reviewed by me 02/04/24: Normal sinus rhythm rate of 70 nonspecific ST-T wave changes   DATA reviewed by me 02/04/24: last 24h vitals tele labs imaging I/O, hospitalist progress note  Principal Problem:  NSTEMI (non-ST elevated myocardial infarction) [facility])    ASSESSMENT AND PLAN: Beth Moore is a 58 y.o. female with a past medical history of HTN, COPD, tobacco use who presented to the ED on 02/02/2024 for chest pain. Cardiology was consulted for further evaluation.   # Chest pain # Elevated troponins # Hypertension # COPD # Tobacco use Patient presented with complaints of CP, has had recurrence throughout admission. Trops 54 > 146 > 236 > 89. Started on IV heparin in the ED. -Plan for nuclear stress test today for additional evaluation. Further recommendations pending results.  -Continue aspirin 81 mg daily.  -Continue lisinopril  20 mg daily.   ADDENDUM: Stress test without ischemia. Will trial PPI and low dose imdur for symptoms. Follow up with Dr. Custovic in 1-2 weeks.  This patient's case was discussed and created with Dr. Ammon and he is in agreement.  Signed:  Danita Bloch, PA-C  02/04/2024, 9:45 AM [facility] Cardiology

## 2024-02-04 NOTE — Discharge Summary (Signed)
 Physician Discharge Summary  Indie Boehne FMW:[id number] DOB: Jul 11, 1965 DOA: 02/02/2024  PCP: [facility]  Admit date: 02/02/2024 Discharge date: 02/04/2024  Admitted From: Home Disposition:  Home  Recommendations for Outpatient Follow-up:  Follow up with PCP in 1-2 weeks Follow-up outpatient cardiology 1 week  Home Health: No Equipment/Devices: None  Discharge Condition: Stable CODE STATUS: Full Diet recommendation: Heart healthy  Brief/Interim Summary: Beth Moore is a 58 y.o. female with medical history significant of HTN, COPD, cigarette smoking, presented with new onset chest pain.   Symptoms started this morning, patient was at a supermarket, was sudden she started to have severe chest pain 8-9/10 pressure-like radiating to left side of neck and left 3 fingers tingling sensation, along with sweating and feeling nausea but no vomiting.  The chest pain was constant, and she did not notice significant exacerbation or relieving factors.  She decided to go back to her car and drove to the hospital herself.  Chest pain subsided in about 45-50 minutes after she arrived in the ED.   It was initial consideration for NSTEMI however feel this to be unlikely.  Patient had peak and troponin at 236 and subsequent downtrend.  No EKG changes.  Patient underwent stress testing with cardiology on 10/6.  No evidence of ischemia.  Chest pain felt to be atypical in nature.  Possibly GERD.  Unable to fully exclude anginal pain and will prescribe a course of Imdur.  Patient stable for discharge at this time.  Follow-up outpatient PCP and cardiology.    Discharge Diagnoses:  Principal Problem:   Chest pain  Atypical chest pain NSTEMI ruled out.  Patient was initially on a heparin gtt.  This has been discontinued.  Patient underwent nuclear stress testing on 10/6.  No evidence of ischemia which makes NSTEMI much less likely.  Discussed with cardiology.  Patient stable for discharge  home.  At time of discharge will initiate low-dose Imdur, daily Protonix .  Reduce home dose of lisinopril  to 10 mg daily.  Strongly advised smoking cessation.  Strongly advised reduction in coffee consumption.  Suggest reduction in lifestyle stressors.  I suspect this pain to be related to GERD versus physiologic stress.  Unable to totally exclude anginal effect  Discharge Instructions  Discharge Instructions     Diet - low sodium heart healthy   Complete by: As directed    Increase activity slowly   Complete by: As directed       Allergies as of 02/04/2024   No Known Allergies      Medication List     STOP taking these medications    Spiriva HandiHaler 18 MCG inhalation capsule Generic drug: tiotropium       TAKE these medications    albuterol  108 (90 Base) MCG/ACT inhaler Commonly known as: VENTOLIN  HFA Inhale 2 puffs into the lungs every 4 (four) hours as needed for wheezing or shortness of breath.   hydrOXYzine 25 MG tablet Commonly known as: ATARAX Take 25 mg by mouth every 6 (six) hours as needed.   isosorbide mononitrate 30 MG 24 hr tablet Commonly known as: IMDUR Take 0.5 tablets (15 mg total) by mouth daily.   levothyroxine 88 MCG tablet Commonly known as: SYNTHROID Take 88 mcg by mouth daily.   lisinopril  10 MG tablet Commonly known as: ZESTRIL  Take 1 tablet (10 mg total) by mouth daily. Start taking on: February 05, 2024 What changed:  medication strength how much to take   nicotine  14 mg/24hr patch Commonly  known as: NICODERM CQ  - dosed in mg/24 hours Place 1 patch (14 mg total) onto the skin daily.   pantoprazole  40 MG tablet Commonly known as: PROTONIX  Take 1 tablet (40 mg total) by mouth daily.        Follow-up Information     Custovic, Annalee, DO. Go in 1 week(s).   Specialty: Cardiology Contact information: [address] [place] [address] [phone]         [facility]. Schedule an appointment  as soon as possible for a visit in 1 week(s).   Specialty: General Practice Contact information: [address] [place] [address] [phone]                No Known Allergies  Consultations: Cardiology- [facility]   Procedures/Studies:     Subjective: Seen and examined the day of discharge.  Stable no distress.  Appropriate discharge home.  Discharge Exam: Vitals:   02/04/24 0727 02/04/24 1444  BP: 111/89 102/72  Pulse: (!) 51 70  Resp: 16 18  Temp: 97.7 F (36.5 C)   SpO2: 100% 99%   Vitals:   02/03/24 2321 02/04/24 0413 02/04/24 0727 02/04/24 1444  BP: 130/77 112/69 111/89 102/72  Pulse: (!) 54  (!) 51 70  Resp: 18 18 16 18   Temp: 97.8 F (36.6 C) 97.8 F (36.6 C) 97.7 F (36.5 C)   TempSrc:    Oral  SpO2: 100% 97% 100% 99%  Weight:      Height:        General: Pt is alert, awake, not in acute distress Cardiovascular: RRR, S1/S2 +, no rubs, no gallops Respiratory: CTA bilaterally, no wheezing, no rhonchi Abdominal: Soft, NT, ND, bowel sounds + Extremities: no edema, no cyanosis    The results of significant diagnostics from this hospitalization (including imaging, microbiology, ancillary and laboratory) are listed below for reference.     Microbiology: No results found for this or any previous visit (from the past 240 hours).   Labs: BNP (last 3 results) No results for input(s): BNP in the last 8760 hours. Basic Metabolic Panel: Recent Labs  Lab 02/02/24 1019  NA 139  K 4.0  CL 97*  CO2 26  GLUCOSE 91  BUN 15  CREATININE 0.78  CALCIUM 9.9   Liver Function Tests: Recent Labs  Lab 02/02/24 1019  AST 26  ALT 17  ALKPHOS 54  BILITOT 1.9*  PROT 8.6*  ALBUMIN 4.8   Recent Labs  Lab 02/02/24 1019  LIPASE 70*   No results for input(s): AMMONIA in the last 168 hours. CBC: Recent Labs  Lab 02/02/24 1019 02/03/24 0343 02/04/24 0532  WBC 9.7 6.4 6.6  HGB 16.6* 14.3 14.1  HCT 51.5* 43.2 42.8  MCV 94.0 91.7 93.4   PLT 269 252 231   Cardiac Enzymes: No results for input(s): CKTOTAL, CKMB, CKMBINDEX, TROPONINI in the last 168 hours. BNP: Invalid input(s): POCBNP CBG: Recent Labs  Lab 02/02/24 0959  GLUCAP 196*   D-Dimer No results for input(s): DDIMER in the last 72 hours. Hgb A1c No results for input(s): HGBA1C in the last 72 hours. Lipid Profile Recent Labs    02/03/24 1109  CHOL 217*  HDL 60  LDLCALC 135*  TRIG 110  CHOLHDL 3.6   Thyroid  function studies Recent Labs    02/02/24 1528  TSH 5.851*   Anemia work up No results for input(s): VITAMINB12, FOLATE, FERRITIN, TIBC, IRON, RETICCTPCT in the last 72 hours. Urinalysis No  results found for: COLORURINE, APPEARANCEUR, LABSPEC, PHURINE, GLUCOSEU, HGBUR, BILIRUBINUR, KETONESUR, PROTEINUR, UROBILINOGEN, NITRITE, LEUKOCYTESUR Sepsis Labs Recent Labs  Lab 02/02/24 1019 02/03/24 0343 02/04/24 0532  WBC 9.7 6.4 6.6   Microbiology No results found for this or any previous visit (from the past 240 hours).   Time coordinating discharge: 40 minutes  SIGNED:   Calvin KATHEE Robson, MD  [facility] 02/04/2024, 3:57 PM Pager   If 7PM-7AM, please contact night-coverage

## 2024-02-04 NOTE — Plan of Care (Signed)
  Problem: Education: Goal: Understanding of cardiac disease, CV risk reduction, and recovery process will improve 02/04/2024 0420 by Vicci Verla CROME, RN Outcome: Progressing 02/04/2024 0420 by Vicci Verla CROME, RN Outcome: Progressing Goal: Individualized Educational Video(s) 02/04/2024 0420 by Vicci Verla CROME, RN Outcome: Progressing 02/04/2024 0420 by Vicci Verla CROME, RN Outcome: Progressing   Problem: Activity: Goal: Ability to tolerate increased activity will improve 02/04/2024 0420 by Vicci Verla CROME, RN Outcome: Progressing 02/04/2024 0420 by Vicci Verla CROME, RN Outcome: Progressing   Problem: Cardiac: Goal: Ability to achieve and maintain adequate cardiovascular perfusion will improve 02/04/2024 0420 by Vicci Verla CROME, RN Outcome: Progressing 02/04/2024 0420 by Vicci Verla CROME, RN Outcome: Progressing   Problem: Health Behavior/Discharge Planning: Goal: Ability to safely manage health-related needs after discharge will improve 02/04/2024 0420 by Vicci Verla CROME, RN Outcome: Progressing 02/04/2024 0420 by Vicci Verla CROME, RN Outcome: Progressing   Problem: Education: Goal: Knowledge of General Education information will improve Description: Including pain rating scale, medication(s)/side effects and non-pharmacologic comfort measures 02/04/2024 0420 by Vicci Verla CROME, RN Outcome: Progressing 02/04/2024 0420 by Vicci Verla CROME, RN Outcome: Progressing   Problem: Health Behavior/Discharge Planning: Goal: Ability to manage health-related needs will improve 02/04/2024 0420 by Vicci Verla CROME, RN Outcome: Progressing 02/04/2024 0420 by Vicci Verla CROME, RN Outcome: Progressing   Problem: Clinical Measurements: Goal: Ability to maintain clinical measurements within normal limits will improve 02/04/2024 0420 by Vicci Verla CROME, RN Outcome: Progressing 02/04/2024 0420 by Vicci Verla CROME, RN Outcome: Progressing Goal: Will remain  free from infection 02/04/2024 0420 by Vicci Verla CROME, RN Outcome: Progressing 02/04/2024 0420 by Vicci Verla CROME, RN Outcome: Progressing Goal: Diagnostic test results will improve 02/04/2024 0420 by Vicci Verla CROME, RN Outcome: Progressing 02/04/2024 0420 by Vicci Verla CROME, RN Outcome: Progressing Goal: Respiratory complications will improve 02/04/2024 0420 by Vicci Verla CROME, RN Outcome: Progressing 02/04/2024 0420 by Vicci Verla CROME, RN Outcome: Progressing Goal: Cardiovascular complication will be avoided 02/04/2024 0420 by Vicci Verla CROME, RN Outcome: Progressing 02/04/2024 0420 by Vicci Verla CROME, RN Outcome: Progressing   Problem: Activity: Goal: Risk for activity intolerance will decrease 02/04/2024 0420 by Vicci Verla CROME, RN Outcome: Progressing 02/04/2024 0420 by Vicci Verla CROME, RN Outcome: Progressing   Problem: Nutrition: Goal: Adequate nutrition will be maintained 02/04/2024 0420 by Vicci Verla CROME, RN Outcome: Progressing 02/04/2024 0420 by Vicci Verla CROME, RN Outcome: Progressing   Problem: Coping: Goal: Level of anxiety will decrease 02/04/2024 0420 by Vicci Verla CROME, RN Outcome: Progressing 02/04/2024 0420 by Vicci Verla CROME, RN Outcome: Progressing   Problem: Elimination: Goal: Will not experience complications related to bowel motility 02/04/2024 0420 by Vicci Verla CROME, RN Outcome: Progressing 02/04/2024 0420 by Vicci Verla CROME, RN Outcome: Progressing Goal: Will not experience complications related to urinary retention 02/04/2024 0420 by Vicci Verla CROME, RN Outcome: Progressing 02/04/2024 0420 by Vicci Verla CROME, RN Outcome: Progressing   Problem: Pain Managment: Goal: General experience of comfort will improve and/or be controlled 02/04/2024 0420 by Vicci Verla CROME, RN Outcome: Progressing 02/04/2024 0420 by Vicci Verla CROME, RN Outcome: Progressing   Problem: Safety: Goal: Ability to  remain free from injury will improve 02/04/2024 0420 by Vicci Verla CROME, RN Outcome: Progressing 02/04/2024 0420 by Vicci Verla CROME, RN Outcome: Progressing   Problem: Skin Integrity: Goal: Risk for impaired skin integrity will decrease 02/04/2024 0420 by Vicci Verla CROME, RN Outcome: Progressing 02/04/2024 0420 by Vicci Verla CROME, RN Outcome: Progressing

## 2024-02-05 LAB — LIPOPROTEIN A (LPA): Lipoprotein (a): 90.5 nmol/L — ABNORMAL HIGH (ref <75.0)

## 2024-03-03 ENCOUNTER — Ambulatory Visit: Admitting: Pulmonary Disease

## 2024-03-03 ENCOUNTER — Other Ambulatory Visit
Admission: RE | Admit: 2024-03-03 | Discharge: 2024-03-03 | Disposition: A | Discharge status: Home / Self Care | Source: Ambulatory Visit | Attending: Pulmonary Disease | Admitting: Pulmonary Disease

## 2024-03-03 ENCOUNTER — Encounter: Payer: Self-pay | Admitting: Pulmonary Disease

## 2024-03-03 VITALS — BP 108/66 | HR 69 | Temp 97.8°F | Ht 63.0 in | Wt 97.8 lb

## 2024-03-03 DIAGNOSIS — J432 Centrilobular emphysema: Secondary | ICD-10-CM | POA: Insufficient documentation

## 2024-03-03 DIAGNOSIS — F17210 Nicotine dependence, cigarettes, uncomplicated: Secondary | ICD-10-CM | POA: Diagnosis not present

## 2024-03-03 DIAGNOSIS — R0602 Shortness of breath: Secondary | ICD-10-CM

## 2024-03-03 LAB — NITRIC OXIDE: Nitric Oxide: 17

## 2024-03-03 MED ORDER — TRELEGY ELLIPTA 100-62.5-25 MCG/ACT IN AEPB: 1.0000 | INHALATION_SPRAY | Freq: Every day | RESPIRATORY_TRACT | 3 refills | Status: AC

## 2024-03-03 NOTE — Progress Notes (Unsigned)
 Synopsis: Referred in by Livingston Mt, MD   Subjective:   PATIENT ID: Beth Moore GENDER: female DOB: June 09, 1965, MRN: [id number]  Chief Complaint  Patient presents with   Consult    SOB. Occasional wheezing. Cough with dark brown sputum.  Albuterol - PRN  Spiriva- 2 puffs daily does not really help.    HPI Discussed the use of AI scribe software for clinical note transcription with the patient, who gave verbal consent to proceed.  History of Present Illness   Beth Moore is a 58 year old female with emphysema who presents with breathing difficulties.  She has experienced intermittent breathing difficulties since December, particularly in the mornings when she wakes up with a swollen throat and difficulty breathing for the first 30 minutes to an hour. She uses ice packs on her neck to alleviate these symptoms. Despite multiple doctor visits, the issue persists.  Approximately two and a half weeks ago, she was bitten by a dog, developed cellulitis. Subsequently was found to have a small left apical pneumothorax that self resolved 01/2024.   She currently uses Spiriva and an emergency inhaler, which she has used about five times over the past six to seven months. She was prescribed medication for acid reflux but has not taken it. She denies current shortness of breath, chest tightness, wheezing, or chest pain.  She has a significant smoking history, having started at age 33, and currently smokes about five cigarettes a day, down from half a pack. She has attempted to quit several times in the past for short periods. There is a family history of asthma and heart disease, with her mother having asthma and a great aunt having emphysema.        History reviewed. No pertinent family history.   Social History   Socioeconomic History   Marital status: Divorced    Spouse name: Not on file   Number of children: Not on file   Years of education: Not on file   Highest education level:  Not on file  Occupational History   Not on file  Tobacco Use   Smoking status: Every Day    Current packs/day: 0.50    Types: Cigarettes   Smokeless tobacco: Never   Tobacco comments:    Smokes 0.5 PPD - khj 03/03/2024        Started smoking at 58 years old    Smoked 0.5 PPD at her heaviest  Substance and Sexual Activity   Alcohol use: No   Drug use: No   Sexual activity: Yes  Other Topics Concern   Not on file  Social History Narrative   Not on file   Social Drivers of Health   Financial Resource Strain: High Risk (08/23/2023)   Received from [facility]   Overall Financial Resource Strain (CARDIA)    Difficulty of Paying Living Expenses: Hard  Food Insecurity: No Food Insecurity (02/02/2024)   Hunger Vital Sign    Worried About Running Out of Food in the Last Year: Never true    Ran Out of Food in the Last Year: Never true  Transportation Needs: No Transportation Needs (02/02/2024)   PRAPARE - Administrator, Civil Service (Medical): No    Lack of Transportation (Non-Medical): No  Physical Activity: Not on file  Stress: Not on file  Social Connections: Socially Isolated (01/15/2024)   Social Connection and Isolation Panel    Frequency of Communication with Friends and Family: Three times a week  Frequency of Social Gatherings with Friends and Family: Three times a week    Attends Religious Services: Never    Active Member of Clubs or Organizations: No    Attends Banker Meetings: Never    Marital Status: Divorced  Catering Manager Violence: Not At Risk (02/02/2024)   Humiliation, Afraid, Rape, and Kick questionnaire    Fear of Current or Ex-Partner: No    Emotionally Abused: No    Physically Abused: No    Sexually Abused: No        Objective:   Vitals:   03/03/24 1524  BP: 108/66  Pulse: 69  Temp: 97.8 F (36.6 C)  SpO2: 98%  Weight: 97 lb 12.8 oz (44.4 kg)  Height: 5' 3 (1.6 m)   98% on  RA BMI Readings  from Last 3 Encounters:  03/03/24 17.32 kg/m  02/02/24 17.77 kg/m  01/15/24 17.77 kg/m   Wt Readings from Last 3 Encounters:  03/03/24 97 lb 12.8 oz (44.4 kg)  02/02/24 100 lb 5 oz (45.5 kg)  01/15/24 100 lb 5 oz (45.5 kg)    Physical Exam GEN: NAD, Healthy Appearing HEENT: Supple Neck, Reactive Pupils, EOMI  CVS: Normal S1, Normal S2, RRR, No murmurs or ES appreciated  Lungs: Poor air movement.  Abdomen: Soft, non tender, non distended, + BS  Extremities: Warm and well perfused, No edema    Ancillary Information   CBC    Component Value Date/Time   WBC 6.6 02/04/2024 0532   RBC 4.58 02/04/2024 0532   HGB 14.1 02/04/2024 0532   HCT 42.8 02/04/2024 0532   PLT 231 02/04/2024 0532   MCV 93.4 02/04/2024 0532   MCH 30.8 02/04/2024 0532   MCHC 32.9 02/04/2024 0532   RDW 12.4 02/04/2024 0532   LYMPHSABS 1.2 08/26/2023 0847   MONOABS 0.4 08/26/2023 0847   EOSABS 0.2 08/26/2023 0847   BASOSABS 0.1 08/26/2023 0847         No data to display           Assessment & Plan:  Assessment and Plan    #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 CT scan shows significant emphysema without suspicious nodules. Differential diagnosis includes alpha-1 antitrypsin deficiency. Emphysema progression can be slowed by smoking cessation and avoiding secondhand smoke. Trelegy Ellipta inhaler recommended for maintenance therapy. FENO 17 indeterminate for eosinophilic inflammation.  - Order pulmonary function test to assess COPD severity. - Order blood test for alpha-1 antitrypsin deficiency. - Discontinue Spiriva and initiate Trelegy Ellipta, one puff daily. - Instruct to rinse mouth after using Trelegy Ellipta. - Recommend annual lung cancer screening with CT scan. - Advise smoking cessation and avoidance of secondhand smoke.  #Recent small secondary  spontaneous left apical pneumothorax that self resolved. Likely in the setting of emhpysema and fall.   #Tobacco use disorder Long-standing tobacco use disorder with current smoking reduced to five cigarettes per day. Smoking cessation is crucial to slow emphysema progression. Discussed nicotine  lozenges as a cessation aid and emphasized setting a quit date. - Advise setting a specific quit date for smoking cessation. - Recommend nicotine  lozenges as needed for cravings. - Encourage removal of all cigarettes the night before quit date.   Smoking/Tobacco Cessation Counseling Geneva Barrero is a current user of tobacco or nicotine  products. She is considering quitting at this time. Counseling provided today addressed the risks of continued use and the benefits of cessation.  Discussed tobacco/nicotine  use history, readiness to quit, and evidence-based treatment options including behavioral strategies, support resources, and pharmacologic therapies. Provided encouragement and educational materials on steps and resources to quit smoking. Patient questions were addressed, and follow-up recommended for continued support. Total time spent on counseling: 5 minutes.  ,m      Return in about 4 months (around 07/01/2024).  I personally spent a total of 60 minutes in the care of the patient today including preparing to see the patient, getting/reviewing separately obtained history, performing a medically appropriate exam/evaluation, counseling and educating, placing orders, documenting clinical information in the EHR, independently interpreting results, and communicating results.   Darrin Barn, MD [facility] 03/03/2024 3:49 PM

## 2024-03-06 LAB — ALPHA-1-ANTITRYPSIN PHENOTYP: A-1 Antitrypsin, Ser: 149 mg/dL (ref 101–187)
